# Patient Record
Sex: Male | Born: 1960
Health system: Southern US, Community
[De-identification: ages and names within clinical notes are randomized; demographics above are authoritative.]

## PROBLEM LIST (undated history)

## (undated) DIAGNOSIS — Z8619 Personal history of other infectious and parasitic diseases: Secondary | ICD-10-CM

## (undated) HISTORY — DX: Personal history of other infectious and parasitic diseases: Z86.19

## (undated) HISTORY — PX: TONSILLECTOMY: SUR1361

---

## 2013-11-23 LAB — HEMOGLOBIN A1C: Hemoglobin A1C: 5.8

## 2013-11-23 LAB — LIPID PANEL
Cholesterol: 195 (ref 0–200)
HDL: 44 (ref 35–70)
LDL CALC: 126
TRIGLYCERIDES: 126 (ref 40–160)

## 2013-11-23 LAB — TSH: TSH: 3.91 (ref 0.41–5.90)

## 2014-04-12 HISTORY — PX: OTHER SURGICAL HISTORY: SHX169

## 2015-03-18 DIAGNOSIS — M4712 Other spondylosis with myelopathy, cervical region: Secondary | ICD-10-CM | POA: Insufficient documentation

## 2015-04-13 HISTORY — PX: CERVICAL FUSION: SHX112

## 2015-09-12 DIAGNOSIS — Z981 Arthrodesis status: Secondary | ICD-10-CM | POA: Insufficient documentation

## 2015-11-05 DIAGNOSIS — M542 Cervicalgia: Secondary | ICD-10-CM | POA: Diagnosis not present

## 2015-11-05 DIAGNOSIS — Z981 Arthrodesis status: Secondary | ICD-10-CM | POA: Diagnosis not present

## 2015-11-05 DIAGNOSIS — Z4789 Encounter for other orthopedic aftercare: Secondary | ICD-10-CM | POA: Diagnosis not present

## 2015-11-05 DIAGNOSIS — R200 Anesthesia of skin: Secondary | ICD-10-CM | POA: Diagnosis not present

## 2016-08-04 DIAGNOSIS — Z4789 Encounter for other orthopedic aftercare: Secondary | ICD-10-CM | POA: Diagnosis not present

## 2016-08-04 DIAGNOSIS — Z981 Arthrodesis status: Secondary | ICD-10-CM | POA: Diagnosis not present

## 2016-08-04 DIAGNOSIS — M4802 Spinal stenosis, cervical region: Secondary | ICD-10-CM | POA: Diagnosis not present

## 2016-08-04 DIAGNOSIS — R200 Anesthesia of skin: Secondary | ICD-10-CM | POA: Diagnosis not present

## 2016-08-04 DIAGNOSIS — R202 Paresthesia of skin: Secondary | ICD-10-CM | POA: Diagnosis not present

## 2017-08-05 DIAGNOSIS — M729 Fibroblastic disorder, unspecified: Secondary | ICD-10-CM | POA: Diagnosis not present

## 2017-08-10 ENCOUNTER — Ambulatory Visit: Payer: Self-pay | Admitting: Physician Assistant

## 2017-08-16 ENCOUNTER — Ambulatory Visit: Payer: Self-pay | Admitting: Physician Assistant

## 2017-08-17 ENCOUNTER — Ambulatory Visit (INDEPENDENT_AMBULATORY_CARE_PROVIDER_SITE_OTHER): Payer: BLUE CROSS/BLUE SHIELD | Admitting: Physician Assistant

## 2017-08-17 ENCOUNTER — Encounter: Payer: Self-pay | Admitting: Physician Assistant

## 2017-08-17 VITALS — BP 150/90 | HR 56 | Temp 97.7°F | Ht 72.5 in | Wt 229.4 lb

## 2017-08-17 DIAGNOSIS — Z23 Encounter for immunization: Secondary | ICD-10-CM

## 2017-08-17 DIAGNOSIS — I6521 Occlusion and stenosis of right carotid artery: Secondary | ICD-10-CM

## 2017-08-17 DIAGNOSIS — Z7689 Persons encountering health services in other specified circumstances: Secondary | ICD-10-CM | POA: Diagnosis not present

## 2017-08-17 NOTE — Patient Instructions (Signed)
You will be contacted about your cardiology referral.  I recommend getting a fasting lipid panel checked prior to your cardiology visit.

## 2017-08-17 NOTE — Progress Notes (Signed)
Shawn Kent is a 57 y.o. male here to Establish Care.  I acted as a Neurosurgeon for [employer], PA-C Corky Mull, LPN  History of Present Illness:   Chief Complaint  Patient presents with  . Establish Care    BC/BS  . Needs referral for Cardiology    ? Right Carotid     Acute Concerns: Carotid stenosis  -- wife goes to [facility], and someone there performed an ultrasound scan on his carotids and told him that his R carotid artery had plaque in it. He is here because he states that he would like a cardiologist to evaluate. No family history of stroke. Does have family history of MI, dad was in possible 50-60s when he had his MI. Drinks about 1-2 alcoholic drinks per night. Does not take a daily ASA. Exercises by walking daily, wears a FitBit for a work wellness program. Denies any exertional symptoms such as CP, SOB, or dizziness/lightheadedness, pre-syncope with activity. He reports that he gets annual blood work through work and his cholesterol has been elevated in the past but to his knowledge it is now under control.  Health Maintenance: Immunizations -- Tdap today Colonoscopy -- at age 3, was normal Diet -- eats all food groups, does do a lot of traveling and has poor food choices Sleep habits -- sleeps well, doesn't snore Weight -- Weight: 229 lb 6.1 oz (104 kg) -- this is usual weight for him Body mass index is 30.68 kg/m.   Depression screen PHQ 2/9 08/17/2017  Decreased Interest 0  Down, Depressed, Hopeless 0  PHQ - 2 Score 0    No flowsheet data found.    Past Medical History:  Diagnosis Date  . History of chicken pox      Social History   Socioeconomic History  . Marital status: Married    Spouse name: Not on file  . Number of children: Not on file  . Years of education: Not on file  . Highest education level: Not on file  Occupational History  . Not on file  Social Needs  . Financial resource strain: Not on file  . Food  insecurity:    Worry: Not on file    Inability: Not on file  . Transportation needs:    Medical: Not on file    Non-medical: Not on file  Tobacco Use  . Smoking status: Never Smoker  . Smokeless tobacco: Never Used  Substance and Sexual Activity  . Alcohol use: Yes    Alcohol/week: 6.0 oz    Types: 10 Cans of beer per week  . Drug use: Never  . Sexual activity: Yes  Lifestyle  . Physical activity:    Days per week: Not on file    Minutes per session: Not on file  . Stress: Not on file  Relationships  . Social connections:    Talks on phone: Not on file    Gets together: Not on file    Attends religious service: Not on file    Active member of club or organization: Not on file    Attends meetings of clubs or organizations: Not on file    Relationship status: Not on file  . Intimate partner violence:    Fear of current or ex partner: Not on file    Emotionally abused: Not on file    Physically abused: Not on file    Forced sexual activity: Not on file  Other Topics Concern  . Not on file  Social History Narrative   Works for Engineer, agricultural    Two children -- 47 and 55 y/o   Married    Past Surgical History:  Procedure Laterality Date  . CERVICAL FUSION  2017   C 4, 5, 6, 7  . Right Shoulder Surgery Right 2016  . TONSILLECTOMY      Family History  Problem Relation Age of Onset  . Lung cancer Mother   . Leukemia Father   . Skin cancer Paternal Grandmother     No Known Allergies   Current Medications:   Current Outpatient Medications:  .  ibuprofen (ADVIL,MOTRIN) 200 MG tablet, Take 400 mg by mouth as needed. , Disp: , Rfl:  .  Ibuprofen-diphenhydrAMINE Cit (ADVIL PM PO), Take 1 tablet by mouth at bedtime., Disp: , Rfl:    Review of Systems:   ROS  Negative unless otherwise specified per HPI.  Vitals:   Vitals:   08/17/17 1335  BP: (!) 142/90  Pulse: 60  Temp: 97.7 F (36.5 C)  TempSrc: Oral  SpO2: 96%  Weight: 229 lb 6.1 oz (104 kg)  Height: 6'  0.5" (1.842 m)     Body mass index is 30.68 kg/m.  Physical Exam:   Physical Exam  Constitutional: He appears well-developed. He is cooperative.  Non-toxic appearance. He does not have a sickly appearance. He does not appear ill. No distress.  Cardiovascular: Normal rate, regular rhythm, S1 normal, S2 normal, normal heart sounds and normal pulses.  No LE edema  Pulmonary/Chest: Effort normal and breath sounds normal.  Neurological: He is alert. GCS eye subscore is 4. GCS verbal subscore is 5. GCS motor subscore is 6.  Skin: Skin is warm, dry and intact.  Psychiatric: He has a normal mood and affect. His speech is normal and behavior is normal.  Nursing note and vitals reviewed.   Assessment and Plan:    Shawn Kent was seen today for establish care and needs referral for cardiology.  Diagnoses and all orders for this visit:  Encounter to establish care  Carotid stenosis, right -     Ambulatory referral to Cardiology  Need for prophylactic vaccination with combined diphtheria-tetanus-pertussis (DTP) vaccine -     Tdap vaccine greater than or equal to 7yo IM   Refer to cardiology for further evaluation and treatment. He declined getting labwork done here, states that he is able to get his labwork done through work, and also states that he does not need an order from me to do this. I recommended that he attempt to obtain this labwork prior to his appointment with cardiology.  . Reviewed expectations re: course of current medical issues. . Discussed self-management of symptoms. . Outlined signs and symptoms indicating need for more acute intervention. . Patient verbalized understanding and all questions were answered. . See orders for this visit as documented in the electronic medical record. . Patient received an After-Visit Summary.   CMA or LPN served as scribe during this visit. History, Physical, and Plan performed by medical provider. Documentation and orders reviewed and  attested to.   Jarold Motto, PA-C

## 2017-08-24 LAB — LIPID PANEL
Cholesterol: 186 (ref 0–200)
HDL: 51 (ref 35–70)
LDL CALC: 119
Triglycerides: 67 (ref 40–160)

## 2017-08-24 LAB — BASIC METABOLIC PANEL: GLUCOSE: 93

## 2017-08-25 ENCOUNTER — Telehealth: Payer: Self-pay | Admitting: Physician Assistant

## 2017-08-25 NOTE — Telephone Encounter (Signed)
Pt. Called to report he had fasting lab work 08/24/17 at [facility]: HDL   51  (> 40) LDL    119 (<130) Total cholesterol  186 (<200) Triglycerides   67 ( <150) Cholesterol ratio  3.6  (<5) Glucose  93  (<100) BP Reading  136/75

## 2017-08-25 NOTE — Telephone Encounter (Signed)
See note

## 2017-08-26 ENCOUNTER — Encounter: Payer: Self-pay | Admitting: Physician Assistant

## 2017-08-26 NOTE — Telephone Encounter (Signed)
Spoke to pt, told him we received lab work and Lelon Mast has reviewed your fasting cholesterol labs. Per Lelon Mast she has calculated his 10-year risk of having a major cardiac event, such as stroke or heart attack, and based on his current information and labs, his risk is 5.6%. This is considered borderline.  If we were to start a cholesterol-lowering medication, such as a statin, his risk would decrease to 4.2%.  Regardless, continue to work on minimizing high fat foods and decreasing portions of carbohydrates.  If he is interested in starting a cholesterol medication, please let this provider know and I will send one in for him to start.  I recommend re-checking his cholesterol in 3 months.  Pt verbalized understanding and is declining to start medication at this time. Pt said he has an appt with Cardiology at the end of month and will see what they say. Told pt okay I will put labs in your chart so Cardiology can see them. Pt verbalized understanding.

## 2017-08-26 NOTE — Telephone Encounter (Signed)
Please call patient and let him know that I reviewed his fasting cholesterol labs.  I have calculated his 10-year risk of having a major cardiac event, such as stroke or heart attack, and based on his current information and labs, his risk is 5.6%. This is considered borderline.  If we were to start a cholesterol-lowering medication, such as a statin, his risk would decrease to 4.2%.  Regardless, continue to work on minimizing high fat foods and decreasing portions of carbohydrates.  If he is interested in starting a cholesterol medication, please let this provider know and I will send one in for him to start.  I recommend re-checking his cholesterol in 3 months.  Lelon Mast

## 2017-09-09 ENCOUNTER — Ambulatory Visit (INDEPENDENT_AMBULATORY_CARE_PROVIDER_SITE_OTHER): Payer: BLUE CROSS/BLUE SHIELD | Admitting: Cardiovascular Disease

## 2017-09-09 ENCOUNTER — Encounter: Payer: Self-pay | Admitting: Cardiovascular Disease

## 2017-09-09 VITALS — BP 150/92 | HR 61 | Ht 72.5 in | Wt 228.0 lb

## 2017-09-09 DIAGNOSIS — I739 Peripheral vascular disease, unspecified: Secondary | ICD-10-CM

## 2017-09-09 DIAGNOSIS — E785 Hyperlipidemia, unspecified: Secondary | ICD-10-CM | POA: Insufficient documentation

## 2017-09-09 DIAGNOSIS — I10 Essential (primary) hypertension: Secondary | ICD-10-CM | POA: Diagnosis not present

## 2017-09-09 DIAGNOSIS — I6521 Occlusion and stenosis of right carotid artery: Secondary | ICD-10-CM | POA: Diagnosis not present

## 2017-09-09 DIAGNOSIS — E7800 Pure hypercholesterolemia, unspecified: Secondary | ICD-10-CM | POA: Diagnosis not present

## 2017-09-09 DIAGNOSIS — I779 Disorder of arteries and arterioles, unspecified: Secondary | ICD-10-CM | POA: Insufficient documentation

## 2017-09-09 NOTE — Patient Instructions (Signed)
Medication Instructions: Your physician recommends that you continue on your current medications as directed. Please refer to the Current Medication list given to you today.  Labwork: Your physician recommends that you return for a FASTING lipid profile and hepatic function panel in 3 months (August).   Testing/Procedures: Your physician has requested that you have a carotid duplex. This test is an ultrasound of the carotid arteries in your neck. It looks at blood flow through these arteries that supply the brain with blood. Allow one hour for this exam. There are no restrictions or special instructions.  Follow-Up: Your physician recommends that you schedule a follow-up appointment in: 1 month with PharmD. Your physician has requested that you regularly monitor and record your blood pressure readings at home. Please use the same machine at the same time of day to check your readings and record them to bring to your follow-up visit.  Your physician recommends that you schedule a follow-up appointment in: 3 months with Dr. Allyson Sabal.

## 2017-09-09 NOTE — Assessment & Plan Note (Signed)
Recent duplex in his primary care doctor's office raise the possibility of right carotid artery disease.  I cannot hear a bruit on exam.  We will repeat carotid Doppler studies.

## 2017-09-09 NOTE — Progress Notes (Signed)
09/09/2017 [name]   [birth year]  [id number]  Primary Physician Jarold Motto, PA Primary Cardiologist: Runell Gess MD Nicholes Calamity, [place]  HPI:  Shawn Kent is a 57 y.o. mildly overweight married Caucasian male father of 2 children who is accompanied by his wife Shawn Kent today.  He is referred by Jarold Motto, PA for cardiovascular evaluation because of family history of heart disease plaque in his right carotid artery by duplex ultrasound.  His risk factors include family history with a father who had his first myocardial infarction in his 66s.  He does have mild hyperlipidemia with an LDL of 119 and mild hypertension as well by readings in doctor's offices recently.  He is never had a heart attack or stroke.  He denies chest pain or shortness of breath.  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   Current Meds  Medication Sig  . ibuprofen (ADVIL,MOTRIN) 200 MG tablet Take 400 mg by mouth as needed.   . Ibuprofen-diphenhydrAMINE Cit (ADVIL PM PO) Take 1 tablet by mouth at bedtime.     No Known Allergies  Social History   Socioeconomic History  . Marital status: Married    Spouse name: Not on file  . Number of children: Not on file  . Years of education: Not on file  . Highest education level: Not on file  Occupational History  . Not on file  Social Needs  . Financial resource strain: Not on file  . Food insecurity:    Worry: Not on file    Inability: Not on file  . Transportation needs:    Medical: Not on file    Non-medical: Not on file  Tobacco Use  . Smoking status: Never Smoker  . Smokeless tobacco: Never Used  Substance and Sexual Activity  . Alcohol use: Yes    Alcohol/week: 6.0 oz    Types: 10 Cans of beer per week  . Drug use: Never  . Sexual activity: Yes  Lifestyle  . Physical activity:    Days per week: Not on file    Minutes per session: Not on file  . Stress: Not on file  Relationships  .  Social connections:    Talks on phone: Not on file    Gets together: Not on file    Attends religious service: Not on file    Active member of club or organization: Not on file    Attends meetings of clubs or organizations: Not on file    Relationship status: Not on file  . Intimate partner violence:    Fear of current or ex partner: Not on file    Emotionally abused: Not on file    Physically abused: Not on file    Forced sexual activity: Not on file  Other Topics Concern  . Not on file  Social History Narrative   Works for Engineer, agricultural    Two children -- 46 and 32 y/o   Married     Review of Systems: General: negative for chills, fever, night sweats or weight changes.  Cardiovascular: negative for chest pain, dyspnea on exertion, edema, orthopnea, palpitations, paroxysmal nocturnal dyspnea or shortness of breath Dermatological: negative for rash Respiratory: negative for cough or wheezing Urologic: negative for hematuria Abdominal: negative for nausea, vomiting, diarrhea, bright red blood per rectum, melena, or hematemesis Neurologic: negative for visual changes, syncope, or dizziness All other systems reviewed and are otherwise negative except as noted above.  Blood pressure (!) 150/92, pulse 61, height 6' 0.5" (1.842 m), weight 228 lb (103.4 kg).  General appearance: alert and no distress Neck: no adenopathy, no carotid bruit, no JVD, supple, symmetrical, trachea midline and thyroid not enlarged, symmetric, no tenderness/mass/nodules Lungs: clear to auscultation bilaterally Heart: regular rate and rhythm, S1, S2 normal, no murmur, click, rub or gallop Extremities: extremities normal, atraumatic, no cyanosis or edema Pulses: 2+ and symmetric Skin: Skin color, texture, turgor normal. No rashes or lesions Neurologic: Alert and oriented X 3, normal strength and tone. Normal symmetric reflexes. Normal coordination and gait  EKG sinus rhythm at 61 without ST or T wave changes.   I personally reviewed this EKG.  ASSESSMENT AND PLAN:   Hyperlipidemia History of mild hyperlipidemia with LDL measured 08/24/2017 at 956 DL of 51.  He does admit to dietary indiscretion .  We have discussed heart healthy diet and we will recheck a lipid and liver profile in 3 months.  Essential hypertension Recent blood pressure readings have been in the 150/90 range although he says at home is lower.  We talked about salt restriction.  He will keep a blood pressure log daily over the next 30 days and will see Baxter Hire back in the office for review.  Carotid artery disease (HCC) Recent duplex in his primary care doctor's office raise the possibility of right carotid artery disease.  I cannot hear a bruit on exam.  We will repeat carotid Doppler studies.      Runell Gess MD FACP,FACC,FAHA, [facility] 09/09/2017 10:05 AM

## 2017-09-09 NOTE — Assessment & Plan Note (Signed)
History of mild hyperlipidemia with LDL measured 08/24/2017 at 098119 DL of 51.  He does admit to dietary indiscretion .  We have discussed heart healthy diet and we will recheck a lipid and liver profile in 3 months.

## 2017-09-09 NOTE — Assessment & Plan Note (Signed)
Recent blood pressure readings have been in the 150/90 range although he says at home is lower.  We talked about salt restriction.  He will keep a blood pressure log daily over the next 30 days and will see Shawn Kent back in the office for review.

## 2017-09-19 ENCOUNTER — Ambulatory Visit (HOSPITAL_COMMUNITY)
Admission: RE | Admit: 2017-09-19 | Discharge: 2017-09-19 | Disposition: A | Discharge status: Home / Self Care | Payer: BLUE CROSS/BLUE SHIELD | Source: Ambulatory Visit | Attending: Cardiovascular Disease | Admitting: Cardiovascular Disease

## 2017-09-19 DIAGNOSIS — E785 Hyperlipidemia, unspecified: Secondary | ICD-10-CM | POA: Diagnosis not present

## 2017-09-19 DIAGNOSIS — I6521 Occlusion and stenosis of right carotid artery: Secondary | ICD-10-CM | POA: Diagnosis not present

## 2017-09-19 DIAGNOSIS — I10 Essential (primary) hypertension: Secondary | ICD-10-CM | POA: Insufficient documentation

## 2017-09-19 DIAGNOSIS — I6523 Occlusion and stenosis of bilateral carotid arteries: Secondary | ICD-10-CM | POA: Diagnosis not present

## 2017-10-19 ENCOUNTER — Ambulatory Visit (INDEPENDENT_AMBULATORY_CARE_PROVIDER_SITE_OTHER): Payer: BLUE CROSS/BLUE SHIELD | Admitting: Pharmacist Clinician (PhC)/ Clinical Pharmacy Specialist

## 2017-10-19 DIAGNOSIS — I10 Essential (primary) hypertension: Secondary | ICD-10-CM | POA: Diagnosis not present

## 2017-10-19 NOTE — Progress Notes (Signed)
     10/20/2017 Shawn SalmMatthew Dente 12/06/1960 [id number]   HPI:  Shawn Kent is a 57 y.o. male patient of Dr Allyson SabalBerry, with a PMH below who presents today for hypertension clinic evaluation.  In addition to hypertension, his medical history is significant for hyperlipidemia and CAD of carotid arteries.  He has a family history of coronary disease, as his father had his first MI in his 2050's.   When he saw Dr. Allyson SabalBerry last month, he noted that BP readings in the MD offices were usually higher than those he got at home or [employer] drives.   He was asked to keep a month-long diary of readings, and returns today with his home cuff.  Unfortunately he forgot this list of readings, but notes that they were mostly in the 140/80's.  States that until the past year he was always in the 120 systolic range.    He currently takes no medications.    Blood Pressure Goal:  130/80  Current Medications:  None  Family Hx:  Father with first MI in 5650's   Social Hx:  No tobacco, some alcohol 1-3 drinks most days; coffee at home most mornings; occasional soda   Diet:  Pleas Kochravels a lot with his work so eats out regularly; is not adding salt; trying to eat less fast food (in the past month has only eaten at [employer] 3-4 times vs maybe 12-15 in previous month)  Exercise:  Walks regularly, hikes, bird watches; nothing strenuous or cardiovascur  Intolerances:   nkda  CrCl cannot be calculated (No order found.).  Wt Readings from Last 3 Encounters:  09/09/17 228 lb (103.4 kg)  08/17/17 229 lb 6.1 oz (104 kg)   BP Readings from Last 3 Encounters:  10/19/17 140/84  09/09/17 (!) 150/92  08/17/17 (!) 150/90   Pulse Readings from Last 3 Encounters:  10/19/17 68  09/09/17 61  08/17/17 (!) 56    Current Outpatient Medications  Medication Sig Dispense Refill  . ibuprofen (ADVIL,MOTRIN) 200 MG tablet Take 400 mg by mouth as needed.     . Ibuprofen-diphenhydrAMINE Cit (ADVIL PM PO) Take 1 tablet by  mouth at bedtime.     No current facility-administered medications for this visit.     No Known Allergies  Past Medical History:  Diagnosis Date  . History of chicken pox     Blood pressure 140/84, pulse 68.  Essential hypertension Patient with hypertension, but no other disease states.  His ASCVD 10 year risk is 5.9%.  Based on this, his current BP goal is > 140/90.  We had a long discussion on the need for medication versus working on lifestyle modifications.  He admits to needing to make changes in his eating habits and get back into exercise.   Ultimately he would like to take 2-3 months and try to make some lifestyle changes, knowing that if he is still > 140 in 2-3 months.   He is due to see Dr. Allyson SabalBerry at the end of August.  We can follow up after that should he need medication.     Phillips HayKristin Lastacia Solum PharmD CPP [facility] [address] [place], [place] [id number] [phone]

## 2017-10-19 NOTE — Patient Instructions (Addendum)
Return for a a follow up appointment in 3 months  Check your blood pressure at home 3-4 times per week and keep record of the readings.  Take your BP meds as follows:   Nothing at this time  Bring all of your meds, your BP cuff and your record of home blood pressures to your next appointment.  Exercise as you're able, try to walk approximately 30 minutes per day.  Keep salt intake to a minimum, especially watch canned and prepared boxed foods.  Eat more fresh fruits and vegetables and fewer canned items.  Avoid eating in fast food restaurants.    HOW TO TAKE YOUR BLOOD PRESSURE: . Rest 5 minutes before taking your blood pressure. .  Don't smoke or drink caffeinated beverages for at least 30 minutes before. . Take your blood pressure before (not after) you eat. . Sit comfortably with your back supported and both feet on the floor (don't cross your legs). . Elevate your arm to heart level on a table or a desk. . Use the proper sized cuff. It should fit smoothly and snugly around your bare upper arm. There should be enough room to slip a fingertip under the cuff. The bottom edge of the cuff should be 1 inch above the crease of the elbow. . Ideally, take 3 measurements at one sitting and record the average.       Exercising to Stay Healthy Exercising regularly is important. It has many health benefits, such as:  Improving your overall fitness, flexibility, and endurance.  Increasing your bone density.  Helping with weight control.  Decreasing your body fat.  Increasing your muscle strength.  Reducing stress and tension.  Improving your overall health.  In order to become healthy and stay healthy, it is recommended that you do moderate-intensity and vigorous-intensity exercise. You can tell that you are exercising at a moderate intensity if you have a higher heart rate and faster breathing, but you are still able to hold a conversation. You can tell that you are exercising at a  vigorous intensity if you are breathing much harder and faster and cannot hold a conversation while exercising. How often should I exercise? Choose an activity that you enjoy and set realistic goals. Your health care provider can help you to make an activity plan that works for you. Exercise regularly as directed by your health care provider. This may include:  Doing resistance training twice each week, such as: ? Push-ups. ? Sit-ups. ? Lifting weights. ? Using resistance bands.  Doing a given intensity of exercise for a given amount of time. Choose from these options: ? 150 minutes of moderate-intensity exercise every week. ? 75 minutes of vigorous-intensity exercise every week. ? A mix of moderate-intensity and vigorous-intensity exercise every week.  Children, pregnant women, people who are out of shape, people who are overweight, and older adults may need to consult a health care provider for individual recommendations. If you have any sort of medical condition, be sure to consult your health care provider before starting a new exercise program. What are some exercise ideas? Some moderate-intensity exercise ideas include:  Walking at a rate of 1 mile in 15 minutes.  Biking.  Hiking.  Golfing.  Dancing.  Some vigorous-intensity exercise ideas include:  Walking at a rate of at least 4.5 miles per hour.  Jogging or running at a rate of 5 miles per hour.  Biking at a rate of at least 10 miles per hour.  Lap swimming.  Roller-skating or in-line skating.  Cross-country skiing.  Vigorous competitive sports, such as football, basketball, and soccer.  Jumping rope.  Aerobic dancing.  What are some everyday activities that can help me to get exercise?  Yard work, such as: ? Pushing a Surveyor, mining. ? Raking and bagging leaves.  Washing and waxing your car.  Pushing a stroller.  Shoveling snow.  Gardening.  Washing windows or floors. How can I be more active in  my day-to-day activities?  Use the stairs instead of the elevator.  Take a walk during your lunch break.  If you drive, park your car farther away from work or school.  If you take public transportation, get off one stop early and walk the rest of the way.  Make all of your phone calls while standing up and walking around.  Get up, stretch, and walk around every 30 minutes throughout the day. What guidelines should I follow while exercising?  Do not exercise so much that you hurt yourself, feel dizzy, or get very short of breath.  Consult your health care provider before starting a new exercise program.  Wear comfortable clothes and shoes with good support.  Drink plenty of water while you exercise to prevent dehydration or heat stroke. Body water is lost during exercise and must be replaced.  Work out until you breathe faster and your heart beats faster. This information is not intended to replace advice given to you by your health care provider. Make sure you discuss any questions you have with your health care provider. Document Released: 05/01/2010 Document Revised: 09/04/2015 Document Reviewed: 08/30/2013 Elsevier Interactive Patient Education  [employer].

## 2017-10-20 ENCOUNTER — Encounter: Payer: Self-pay | Admitting: Pharmacist Clinician (PhC)/ Clinical Pharmacy Specialist

## 2017-10-20 NOTE — Assessment & Plan Note (Addendum)
Patient with hypertension, but no other disease states.  His ASCVD 10 year risk is 5.9%.  Based on this, his current BP goal is > 140/90.  We had a long discussion on the need for medication versus working on lifestyle modifications.  He admits to needing to make changes in his eating habits and get back into exercise.   Ultimately he would like to take 2-3 months and try to make some lifestyle changes, knowing that if he is still > 140 in 2-3 months.   He is due to see Dr. Allyson SabalBerry at the end of August.  We can follow up after that should he need medication.

## 2017-11-23 DIAGNOSIS — E7800 Pure hypercholesterolemia, unspecified: Secondary | ICD-10-CM | POA: Diagnosis not present

## 2017-11-23 LAB — HEPATIC FUNCTION PANEL
ALT: 20 IU/L (ref 0–44)
AST: 15 IU/L (ref 0–40)
Albumin: 4.2 g/dL (ref 3.5–5.5)
Alkaline Phosphatase: 68 IU/L (ref 39–117)
Bilirubin Total: 0.3 mg/dL (ref 0.0–1.2)
Bilirubin, Direct: 0.09 mg/dL (ref 0.00–0.40)
Total Protein: 6.4 g/dL (ref 6.0–8.5)

## 2017-11-23 LAB — LIPID PANEL
CHOL/HDL RATIO: 4.3 ratio (ref 0.0–5.0)
Cholesterol, Total: 192 mg/dL (ref 100–199)
HDL: 45 mg/dL (ref >39)
LDL CALC: 128 mg/dL — AB (ref 0–99)
TRIGLYCERIDES: 93 mg/dL (ref 0–149)
VLDL CHOLESTEROL CAL: 19 mg/dL (ref 5–40)

## 2017-12-09 ENCOUNTER — Encounter: Payer: Self-pay | Admitting: Cardiovascular Disease

## 2017-12-09 ENCOUNTER — Ambulatory Visit (INDEPENDENT_AMBULATORY_CARE_PROVIDER_SITE_OTHER): Payer: BLUE CROSS/BLUE SHIELD | Admitting: Cardiovascular Disease

## 2017-12-09 VITALS — BP 120/80 | HR 60 | Ht 73.0 in | Wt 224.0 lb

## 2017-12-09 DIAGNOSIS — E7800 Pure hypercholesterolemia, unspecified: Secondary | ICD-10-CM | POA: Diagnosis not present

## 2017-12-09 MED ORDER — ROSUVASTATIN CALCIUM 5 MG PO TABS: ORAL_TABLET | ORAL | 3 refills | Status: DC

## 2017-12-09 MED ORDER — COQ10 200 MG PO CAPS: 1.0000 | ORAL_CAPSULE | Freq: Every day | ORAL | 3 refills | Status: DC

## 2017-12-09 NOTE — Progress Notes (Signed)
12/09/2017 Shawn Kent   [birth year]  [id number]  Primary Physician Jarold Motto, PA Primary Cardiologist: Runell Gess MD FACP, [place], [place], [place]  HPI:  Shawn Kent is a 57 y.o.  mildly overweight married Caucasian male father of 2 children who I last saw in the office 09/09/2017. He is referred by Jarold Motto, PA for cardiovascular evaluation because of family history of heart disease plaque in his right carotid artery by duplex ultrasound.  His risk factors include family history with a father who had his first myocardial infarction in his 16s.  He does have mild hyperlipidemia with an LDL of 119 and mild hypertension as well by readings in doctor's offices recently.  He is never had a heart attack or stroke.  He denies chest pain or shortness of breath.  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 He had carotid Dopplers performed in our office 09/19/2017 which were in entirely normal.  Current Meds  Medication Sig  . ibuprofen (ADVIL,MOTRIN) 200 MG tablet Take 400 mg by mouth as needed.   . Ibuprofen-diphenhydrAMINE Cit (ADVIL PM PO) Take 1 tablet by mouth at bedtime.     No Known Allergies  Social History   Socioeconomic History  . Marital status: Married    Spouse name: Not on file  . Number of children: Not on file  . Years of education: Not on file  . Highest education level: Not on file  Occupational History  . Not on file  Social Needs  . Financial resource strain: Not on file  . Food insecurity:    Worry: Not on file    Inability: Not on file  . Transportation needs:    Medical: Not on file    Non-medical: Not on file  Tobacco Use  . Smoking status: Never Smoker  . Smokeless tobacco: Never Used  Substance and Sexual Activity  . Alcohol use: Yes    Alcohol/week: 10.0 standard drinks    Types: 10 Cans of beer per week  . Drug use: Never  . Sexual activity: Yes  Lifestyle  . Physical activity:    Days per  week: Not on file    Minutes per session: Not on file  . Stress: Not on file  Relationships  . Social connections:    Talks on phone: Not on file    Gets together: Not on file    Attends religious service: Not on file    Active member of club or organization: Not on file    Attends meetings of clubs or organizations: Not on file    Relationship status: Not on file  . Intimate partner violence:    Fear of current or ex partner: Not on file    Emotionally abused: Not on file    Physically abused: Not on file    Forced sexual activity: Not on file  Other Topics Concern  . Not on file  Social History Narrative   Works for Engineer, agricultural    Two children -- 75 and 21 y/o   Married     Review of Systems: General: negative for chills, fever, night sweats or weight changes.  Cardiovascular: negative for chest pain, dyspnea on exertion, edema, orthopnea, palpitations, paroxysmal nocturnal dyspnea or shortness of breath Dermatological: negative for rash Respiratory: negative for cough or wheezing Urologic: negative for hematuria Abdominal: negative for nausea, vomiting, diarrhea, bright red blood per rectum, melena, or hematemesis Neurologic: negative for visual changes, syncope, or dizziness  All other systems reviewed and are otherwise negative except as noted above.    Blood pressure 120/80, pulse 60, height 6\' 1"  (1.854 m), weight 224 lb (101.6 kg), SpO2 96 %.  General appearance: alert and no distress Neck: no adenopathy, no carotid bruit, no JVD, supple, symmetrical, trachea midline and thyroid not enlarged, symmetric, no tenderness/mass/nodules Lungs: clear to auscultation bilaterally Heart: regular rate and rhythm, S1, S2 normal, no murmur, click, rub or gallop Extremities: extremities normal, atraumatic, no cyanosis or edema Pulses: 2+ and symmetric Skin: Skin color, texture, turgor normal. No rashes or lesions Neurologic: Alert and oriented X 3, normal strength and tone. Normal  symmetric reflexes. Normal coordination and gait  EKG not performed today.  ASSESSMENT AND PLAN:   Hyperlipidemia History of mild hyperlipidemia with an LDL approximately 128.  Based on his family history.  I am going to put him on low-dose Crestor 5 mg twice a week and we will recheck.  Carotid artery disease (HCC) Prior history of minimal cord carotid artery disease with recent carotid Doppler performed in our office 09/19/2017 that was entirely normal.      Runell GessJonathan J. Rendon Howell MD [facility] CenterFACP,FACC,FAHA, [facility] 12/09/2017 10:52 AM

## 2017-12-09 NOTE — Assessment & Plan Note (Signed)
Prior history of minimal cord carotid artery disease with recent carotid Doppler performed in our office 09/19/2017 that was entirely normal.

## 2017-12-09 NOTE — Patient Instructions (Signed)
Medication Instructions:   START ROSUVASTATIN 5 MG TWICE WEEKLY  START COQ10 200 MG ONCE DAILY  Labwork:  Your physician recommends that you return for lab work in:3 MONTHS PRIOR TO EATING  Testing/Procedures:  CORONARY CALCIUM SCORE  Follow-Up:  Your physician wants you to follow-up in: ONE YEAR WITH DR San MorelleBERRY You will receive a reminder letter in the mail two months in advance. If you don't receive a letter, please call our office to schedule the follow-up appointment.   If you need a refill on your cardiac medications before your next appointment, please call your pharmacy.

## 2017-12-09 NOTE — Assessment & Plan Note (Signed)
History of mild hyperlipidemia with an LDL approximately 128.  Based on his family history.  I am going to put him on low-dose Crestor 5 mg twice a week and we will recheck.

## 2017-12-23 ENCOUNTER — Ambulatory Visit (INDEPENDENT_AMBULATORY_CARE_PROVIDER_SITE_OTHER)
Admission: RE | Admit: 2017-12-23 | Discharge: 2017-12-23 | Disposition: A | Discharge status: Home / Self Care | Payer: Self-pay | Source: Ambulatory Visit | Attending: Cardiovascular Disease | Admitting: Cardiovascular Disease

## 2017-12-23 DIAGNOSIS — E7800 Pure hypercholesterolemia, unspecified: Secondary | ICD-10-CM

## 2018-03-08 ENCOUNTER — Encounter: Payer: Self-pay | Admitting: *Deleted

## 2018-03-08 DIAGNOSIS — E7800 Pure hypercholesterolemia, unspecified: Secondary | ICD-10-CM | POA: Diagnosis not present

## 2018-03-08 LAB — HEPATIC FUNCTION PANEL
ALBUMIN: 4.2 g/dL (ref 3.5–5.5)
ALT: 21 IU/L (ref 0–44)
AST: 19 IU/L (ref 0–40)
Alkaline Phosphatase: 63 IU/L (ref 39–117)
BILIRUBIN TOTAL: 0.5 mg/dL (ref 0.0–1.2)
BILIRUBIN, DIRECT: 0.15 mg/dL (ref 0.00–0.40)
Total Protein: 6.3 g/dL (ref 6.0–8.5)

## 2018-03-08 LAB — LIPID PANEL
CHOL/HDL RATIO: 3.1 ratio (ref 0.0–5.0)
CHOLESTEROL TOTAL: 166 mg/dL (ref 100–199)
HDL: 53 mg/dL (ref >39)
LDL CALC: 98 mg/dL (ref 0–99)
TRIGLYCERIDES: 75 mg/dL (ref 0–149)
VLDL Cholesterol Cal: 15 mg/dL (ref 5–40)

## 2018-03-13 ENCOUNTER — Telehealth: Payer: Self-pay | Admitting: *Deleted

## 2018-03-13 DIAGNOSIS — E7800 Pure hypercholesterolemia, unspecified: Secondary | ICD-10-CM

## 2018-03-13 MED ORDER — ROSUVASTATIN CALCIUM 5 MG PO TABS: 5.0000 mg | ORAL_TABLET | Freq: Every day | ORAL | 3 refills | Status: DC

## 2018-03-13 NOTE — Telephone Encounter (Signed)
-----   Message from Runell GessJonathan J Berry, MD sent at 03/10/2018  8:13 AM EST ----- Improved lipid profile but still not at goal given risk factor profile.  Increase Crestor from every other day to daily and recheck in 3 months.

## 2018-03-13 NOTE — Telephone Encounter (Signed)
Results released to my chart  New script sent to the pharmacy and Lab orders mailed to the pt. 

## 2018-03-16 DIAGNOSIS — H00012 Hordeolum externum right lower eyelid: Secondary | ICD-10-CM | POA: Diagnosis not present

## 2018-03-16 DIAGNOSIS — H524 Presbyopia: Secondary | ICD-10-CM | POA: Diagnosis not present

## 2018-03-17 MED ORDER — ROSUVASTATIN CALCIUM 5 MG PO TABS: 5.0000 mg | ORAL_TABLET | Freq: Every day | ORAL | 3 refills | Status: DC

## 2018-03-17 NOTE — Telephone Encounter (Signed)
Follow up    Patient is calling in reference to his lab results.

## 2018-03-17 NOTE — Addendum Note (Signed)
Addended by: Freddi StarrMATHIS, Keifer Habib W on: 03/17/2018 10:41 AM   Modules accepted: Orders

## 2018-03-17 NOTE — Telephone Encounter (Signed)
Spoke with pt, he was taking crestor 2 days a week not every other day, he will increase to 3 days a week and recheck labs.

## 2018-03-23 DIAGNOSIS — Z1211 Encounter for screening for malignant neoplasm of colon: Secondary | ICD-10-CM | POA: Diagnosis not present

## 2018-03-23 DIAGNOSIS — Z86010 Personal history of colonic polyps: Secondary | ICD-10-CM | POA: Diagnosis not present

## 2018-03-31 DIAGNOSIS — Z1211 Encounter for screening for malignant neoplasm of colon: Secondary | ICD-10-CM | POA: Diagnosis not present

## 2018-03-31 LAB — HM COLONOSCOPY

## 2018-04-20 ENCOUNTER — Encounter: Payer: Self-pay | Admitting: Physician Assistant

## 2018-08-08 ENCOUNTER — Telehealth: Payer: Self-pay | Admitting: Cardiovascular Disease

## 2018-08-10 ENCOUNTER — Telehealth: Payer: Self-pay

## 2018-08-10 ENCOUNTER — Telehealth (INDEPENDENT_AMBULATORY_CARE_PROVIDER_SITE_OTHER): Payer: BLUE CROSS/BLUE SHIELD | Admitting: Cardiovascular Disease

## 2018-08-10 DIAGNOSIS — E782 Mixed hyperlipidemia: Secondary | ICD-10-CM

## 2018-08-10 DIAGNOSIS — I10 Essential (primary) hypertension: Secondary | ICD-10-CM | POA: Diagnosis not present

## 2018-08-10 NOTE — Patient Instructions (Signed)
Medication Instructions:  Your physician recommends that you continue on your current medications as directed. Please refer to the Current Medication list given to you today.  If you need a refill on your cardiac medications before your next appointment, please call your pharmacy.   Lab work: Your physician recommends that you return for lab work: FASTING LIPID PROFILE AND LIVER FUNCTION TEST. YOU WILL RECEIVE A LAB SLIP IN THE MAIL. PLEASE DO NOT EAT OR DRINK (EXCEPT WATER) ANYTHING AFTER MIDNIGHT ON THE DAY YOU CHOOSE TO PRESENT FOR LAB WORK. YOU MAY EAT AFTER YOUR BLOOD HAS BEEN COLLECTED. NO APPOINTMENT IS NEEDED.  If you have labs (blood work) drawn today and your tests are completely normal, you will receive your results only by: Marland Kitchen MyChart Message (if you have MyChart) OR . A paper copy in the mail If you have any lab test that is abnormal or we need to change your treatment, we will call you to review the results.  Testing/Procedures: NONE  Follow-Up: At [facility], you and your health needs are our priority.  As part of our continuing mission to provide you with exceptional heart care, we have created designated Provider Care Teams.  These Care Teams include your primary Cardiologist (physician) and Advanced Practice Providers (APPs -  Physician Assistants and Nurse Practitioners) who all work together to provide you with the care you need, when you need it. You will need a follow up appointment in 6 months with Dr. Allyson Sabal.  Please call our office 2 months in advance to schedule this appointment.    Any Other Special Instructions Will Be Listed Below (If Applicable). KEEP A BLOOD PRESSURE LOG FOR 30 DAYS THEN FOLLOW UP WITH A CLINICAL PHARMACIST IN THE HYPERTENSION CLINIC. YOU WILL NEED AN APPOINTMENT.

## 2018-08-10 NOTE — Progress Notes (Signed)
Virtual Visit via Video Note   This visit type was conducted due to national recommendations for restrictions regarding the COVID-19 Pandemic (e.g. social distancing) in an effort to limit this patient's exposure and mitigate transmission in our community.  Due to his co-morbid illnesses, this patient is at least at moderate risk for complications without adequate follow up.  This format is felt to be most appropriate for this patient at this time.  All issues noted in this document were discussed and addressed.  A limited physical exam was performed with this format.  Please refer to the patient's chart for his consent to tele[facility].   Evaluation Performed:  Follow-up visit  Date:  08/10/2018   ID:  Shawn Kent, DOB 10/19/1960, MRN [id number]  Patient Location: Other:  Outside walking on a trail Provider Location: Home  PCP:  Jarold MottoWorley, Samantha, PA  Cardiologist: Dr. Nanetta BattyJonathan Lyndle Pang Electrophysiologist:  None   Chief Complaint: Hyperlipidemia/hypertension  History of Present Illness:    Shawn SalmMatthew Kent is a 58 y.o.  mildly overweight married Caucasian male father of 2 children who I last saw in the office 12/09/2017. 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  He is referred by Jarold MottoSamantha Worley, PA for cardiovascular evaluation because of family history of heart disease plaque in his right carotid artery by duplex ultrasound. His risk factors include family history with a father who had his first myocardial infarction in his 3350s. He does have mild hyperlipidemia with an LDL of 119 and mild hypertension as well by readings in doctor's offices recently. He is never had a heart attack or stroke. He denies chest pain or shortness of breath. 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 He had carotid Dopplers performed in our office 09/19/2017 which were  in entirely normal.  Since I saw him close to a year ago he is remained stable.  I did begin him on Crestor every other day and ultimately increase that to daily with his last lipid profile performed 03/08/2018 revealing total cluster 166, LDL of 98 and HDL 53.  He is trying to make lifestyle modifications but has not lost any weight.  He does get out and exercise some without limitation.  He is had his blood pressure checked several times recently when having blood drawn which was elevated especially when he found out that he had lost his job.  The patient does not have symptoms concerning for COVID-19 infection (fever, chills, cough, or new shortness of breath).    Past Medical History:  Diagnosis Date  . History of chicken pox    Past Surgical History:  Procedure Laterality Date  . CERVICAL FUSION  2017   C 4, 5, 6, 7  . Right Shoulder Surgery Right 2016  . TONSILLECTOMY       Current Meds  Medication Sig  . Coenzyme Q10 (COQ10) 200 MG CAPS Take 1 capsule by mouth daily.  Marland Kitchen. ibuprofen (ADVIL,MOTRIN) 200 MG tablet Take 400 mg by mouth as needed.   . Ibuprofen-diphenhydrAMINE Cit (ADVIL PM PO) Take 1 tablet by mouth at bedtime.  . rosuvastatin (CRESTOR) 5 MG tablet Take 1 tablet (5 mg total) by mouth daily at 6 PM. Take 1 tablet three days a week (Patient taking differently: Take 5 mg by mouth. Take 1 tablet six days a week.)     Allergies:   Patient  has no known allergies.   Social History   Tobacco Use  . Smoking status: Never Smoker  . Smokeless tobacco: Never Used  Substance Use Topics  . Alcohol use: Yes    Alcohol/week: 10.0 standard drinks    Types: 10 Cans of beer per week  . Drug use: Never     Family Hx: The patient's family history includes Leukemia in his father; Lung cancer in his mother; Skin cancer in his paternal grandmother.  ROS:   Please see the history of present illness.     All other systems reviewed and are negative.   Prior CV studies:   The  following studies were reviewed today:  None  Labs/Other Tests and Data Reviewed:    EKG:  No ECG reviewed.  Recent Labs: 03/08/2018: ALT 21   Recent Lipid Panel Lab Results  Component Value Date/Time   CHOL 166 03/08/2018 09:40 AM   TRIG 75 03/08/2018 09:40 AM   HDL 53 03/08/2018 09:40 AM   CHOLHDL 3.1 03/08/2018 09:40 AM   LDLCALC 98 03/08/2018 09:40 AM    Wt Readings from Last 3 Encounters:  08/10/18 223 lb (101.2 kg)  12/09/17 224 lb (101.6 kg)  09/09/17 228 lb (103.4 kg)     Objective:    Vital Signs:  Pulse 66   Ht 6\' 1"  (1.854 m)   Wt 223 lb (101.2 kg)   BMI 29.42 kg/m    VITAL SIGNS:  reviewed GEN:  no acute distress RESPIRATORY:  normal respiratory effort, symmetric expansion NEURO:  alert and oriented x 3, no obvious focal deficit PSYCH:  normal affect  ASSESSMENT & PLAN:    1. Hyperlipidemia- history of hyperlipidemia on Crestor with lipid profile performed 03/08/2018 revealing total cluster 166, LDL 98 and HDL of 55. 2. Essential hypertension- he has had several blood pressure readings during blood donation which have been elevated.  Does have a blood pressure cuff at home.  We talked about dietary modification and exercise.  I have asked him to keep a blood pressure log for the next 30 days.  I am going to have Kristen, our pharmacist who runs our hypertension clinic, call him in 30 days to review these, calibrate his home cuff and make further recommendations.  COVID-19 Education: The signs and symptoms of COVID-19 were discussed with the patient and how to seek care for testing (follow up with PCP or arrange E-visit).  The importance of social distancing was discussed today.  Time:   Today, I have spent 12 minutes with the patient with telehealth technology discussing the above problems.     Medication Adjustments/Labs and Tests Ordered: Current medicines are reviewed at length with the patient today.  Concerns regarding medicines are outlined  above.   Tests Ordered: No orders of the defined types were placed in this encounter.   Medication Changes: No orders of the defined types were placed in this encounter.   Disposition:  Follow up in 6 month(s)  Signed, Nanetta Batty, MD  08/10/2018 10:09 AM    [facility]

## 2018-08-10 NOTE — Telephone Encounter (Signed)
Patient and/or DPR-approved person aware of AVS instructions and verbalized understanding. Letter including After Visit Summary and any other necessary documents to be mailed to the patient's address on file.  

## 2018-08-26 DIAGNOSIS — R229 Localized swelling, mass and lump, unspecified: Secondary | ICD-10-CM | POA: Diagnosis not present

## 2018-08-26 DIAGNOSIS — W57XXXA Bitten or stung by nonvenomous insect and other nonvenomous arthropods, initial encounter: Secondary | ICD-10-CM | POA: Diagnosis not present

## 2018-09-28 ENCOUNTER — Telehealth: Payer: Self-pay | Admitting: Cardiovascular Disease

## 2018-09-28 NOTE — Telephone Encounter (Signed)
Pt scheduled for Monday 11am.

## 2018-09-28 NOTE — Telephone Encounter (Signed)
New Message    Patient calling in for appt please call to schedule.

## 2018-10-02 ENCOUNTER — Telehealth: Payer: Self-pay | Admitting: Pharmacist Clinician (PhC)/ Clinical Pharmacy Specialist

## 2018-10-02 NOTE — Telephone Encounter (Signed)
This visit type was conducted due to national recommendations for restrictions regarding the COVID-19 Pandemic (e.g. social distancing) in an effort to limit this patient's exposure and mitigate transmission in our community.  Patient was referred to CVRR clinic to discuss hypertension therapy.  Total time on phone with patient: 19 minutes  Shawn Kent 58 y.o. has a cardiac history significant for hypertension and hyperlipidemia and mild coronary artery stenosis (1-39%).  He also has a family history of CAD, with both his father and paternal grandfather having died post MI.    Current medications for hypertension:  none  Medications tried/failed: none  Family history: father and pgf both had MI  Diet:  Eating out some, trying to support local restaurants in Covid 2-3 times per week; rarely adds salt;  Wife cooks from scratch; some fruits and vegetables, but not overly; mostly chicken, some beef, rare pork or fish  Exercise: no routine exercise, some work around the house; had to cancel boxing class due to covid, is hoping to restart in the next week or two.     Home BP readings:  Patient sent in list of last 24 BP readings thru MyChart.  Range was 122-187/76-98, with an average of 157/83.    Labs:  No recent BMET  Assessment/Plan: Reviewed above information with patient.  He would like to start with improved lifestyle choices before trying medication.  We discussed the need for daily walks at a brisk pace (he usually walks to the woods to bird watch) for at least 20 minutes as well as getting back to boxing as allowed.  He will also cut back on his take out foods and try to get a better mix of fruits/vegetables and proteins, as well as cutting back on carbs.  He was scheduled for follow up, in office on 7/27.  He was given verbal instructions on how to best check his blood pressure and bring those readings, along with his meter, when he comes in at that time.  Shawn Kent PharmD CPP  [facility] at [facility] [address] [place], [place] [address]

## 2018-10-09 NOTE — Telephone Encounter (Signed)
Opened in error

## 2018-11-06 ENCOUNTER — Ambulatory Visit (INDEPENDENT_AMBULATORY_CARE_PROVIDER_SITE_OTHER): Payer: BC Managed Care – PPO | Admitting: Pharmacist

## 2018-11-06 ENCOUNTER — Other Ambulatory Visit: Payer: Self-pay

## 2018-11-06 VITALS — BP 120/68 | HR 56 | Ht 73.0 in | Wt 223.6 lb

## 2018-11-06 DIAGNOSIS — I10 Essential (primary) hypertension: Secondary | ICD-10-CM | POA: Diagnosis not present

## 2018-11-06 DIAGNOSIS — E782 Mixed hyperlipidemia: Secondary | ICD-10-CM | POA: Diagnosis not present

## 2018-11-06 LAB — LIPID PANEL
Chol/HDL Ratio: 2.6 ratio (ref 0.0–5.0)
Cholesterol, Total: 134 mg/dL (ref 100–199)
HDL: 52 mg/dL (ref >39)
LDL Calculated: 67 mg/dL (ref 0–99)
Triglycerides: 76 mg/dL (ref 0–149)
VLDL Cholesterol Cal: 15 mg/dL (ref 5–40)

## 2018-11-06 LAB — HEPATIC FUNCTION PANEL
ALT: 25 IU/L (ref 0–44)
AST: 20 IU/L (ref 0–40)
Albumin: 4.4 g/dL (ref 3.8–4.9)
Alkaline Phosphatase: 67 IU/L (ref 39–117)
Bilirubin Total: 0.4 mg/dL (ref 0.0–1.2)
Bilirubin, Direct: 0.12 mg/dL (ref 0.00–0.40)
Total Protein: 6.5 g/dL (ref 6.0–8.5)

## 2018-11-06 NOTE — Progress Notes (Signed)
Patient ID: Shawn Kent                 DOB: 28-Apr-1960                      MRN: [id number]     HPI:  Shawn Kent is a 58 y.o. male referred by Dr. Gwenlyn Found to HTN clinic. PMH includes hypertension , hyperlipidemia ,and mild coronary artery stenosis (1-39%).  He also has a family history of CAD, with both his father and paternal grandfather having died post MI. During last HTN follow up lifestyle modifications were recommended, and daily BP monitoring. No medication was started or discuss with patient at the time.   Denies swelling, shortness of breath, or dizziness. Suffers from chronic number numbming pain but well under control per patient assessment.   Current HTN meds: none  BP goal: <130/80  Family History: Family history: father and paternal grandfather both had MI  Diet:  Eating out some, trying to support local restaurants in Covid 2-3 times per week; rarely adds salt;  Wife cooks from scratch; some fruits and vegetables, but not overly; mostly chicken, some beef, rare pork or fish  Exercise: 3x/week 45-60 minutes no routine exercise, some work around the house; back on boxing classes.      Home BP readings:  25 readings; average 126/78 (pulse range 50-66bpm)  Intelli-sense (Omron) - accurate within 11mmHg in comparison to manual reading  Wt Readings from Last 3 Encounters:  11/06/18 223 lb 9.6 oz (101.4 kg)  08/10/18 223 lb (101.2 kg)  12/09/17 224 lb (101.6 kg)   BP Readings from Last 3 Encounters:  11/06/18 120/68  12/09/17 120/80  10/19/17 140/84   Pulse Readings from Last 3 Encounters:  11/06/18 (!) 56  08/10/18 66  12/09/17 60    Past Medical History:  Diagnosis Date  . History of chicken pox     Current Outpatient Medications on File Prior to Visit  Medication Sig Dispense Refill  . Coenzyme Q10 (COQ10) 200 MG CAPS Take 1 capsule by mouth daily. 90 capsule 3  . ibuprofen (ADVIL,MOTRIN) 200 MG tablet Take 400 mg by mouth as needed.     .  Ibuprofen-diphenhydrAMINE Cit (ADVIL PM PO) Take 1 tablet by mouth at bedtime.    . rosuvastatin (CRESTOR) 5 MG tablet Take 1 tablet (5 mg total) by mouth daily at 6 PM. Take 1 tablet three days a week (Patient taking differently: Take 5 mg by mouth. Take 1 tablet six days a week.) 90 tablet 3   No current facility-administered medications on file prior to visit.     No Known Allergies  Blood pressure 120/68, pulse (!) 56, height 6\' 1"  (1.854 m), weight 223 lb 9.6 oz (101.4 kg), SpO2 98 %.  Essential hypertension Blood pressure well controlled during OV and at home with home BP average of 126/78. Patient denied problems with lifestyle changes and is back in the gym with boxing classes.   Will continue close monitoring and lifestyle changes. No need for medication at this time. Patient encouraged to monitor BP 2-3 times per week and call back if change in BP noted. Will follow up as needed.     Chesky Heyer Rodriguez-Guzman PharmD, BCPS, [place] [id number] 11/08/2018 1:43 PM

## 2018-11-06 NOTE — Patient Instructions (Signed)
Return for a  follow up appointment in  [place] your blood pressure at home daily (if able) and keep record of the readings.  Take your BP meds as follows: *NO MEDICATION CHANGES* *CONTINUE LIFESTYLE MODIFICATIONS*  *CALL [phone]  IF CHANGES IN BLOOD PRESSURE*  Bring all of your meds, your BP cuff and your record of home blood pressures to your next appointment.  Exercise as you're able, try to walk approximately 30 minutes per day.  Keep salt intake to a minimum, especially watch canned and prepared boxed foods.  Eat more fresh fruits and vegetables and fewer canned items.  Avoid eating in fast food restaurants.    HOW TO TAKE YOUR BLOOD PRESSURE: . Rest 5 minutes before taking your blood pressure. .  Don't smoke or drink caffeinated beverages for at least 30 minutes before. . Take your blood pressure before (not after) you eat. . Sit comfortably with your back supported and both feet on the floor (don't cross your legs). . Elevate your arm to heart level on a table or a desk. . Use the proper sized cuff. It should fit smoothly and snugly around your bare upper arm. There should be enough room to slip a fingertip under the cuff. The bottom edge of the cuff should be 1 inch above the crease of the elbow. . Ideally, take 3 measurements at one sitting and record the average.

## 2018-11-08 ENCOUNTER — Encounter: Payer: Self-pay | Admitting: Pharmacist

## 2018-11-08 NOTE — Assessment & Plan Note (Signed)
Blood pressure well controlled during OV and at home with home BP average of 126/78. Patient denied problems with lifestyle changes and is back in the gym with boxing classes.   Will continue close monitoring and lifestyle changes. No need for medication at this time. Patient encouraged to monitor BP 2-3 times per week and call back if change in BP noted. Will follow up as needed.

## 2018-11-16 ENCOUNTER — Ambulatory Visit (INDEPENDENT_AMBULATORY_CARE_PROVIDER_SITE_OTHER): Payer: BC Managed Care – PPO | Admitting: Physician Assistant

## 2018-11-16 ENCOUNTER — Encounter: Payer: Self-pay | Admitting: Physician Assistant

## 2018-11-16 ENCOUNTER — Other Ambulatory Visit: Payer: Self-pay

## 2018-11-16 VITALS — BP 138/80 | HR 57 | Temp 98.4°F | Resp 16 | Ht 72.5 in | Wt 221.0 lb

## 2018-11-16 DIAGNOSIS — S66912A Strain of unspecified muscle, fascia and tendon at wrist and hand level, left hand, initial encounter: Secondary | ICD-10-CM

## 2018-11-16 NOTE — Patient Instructions (Signed)
Please keep wrist elevated while resting.  Wear brace or compression wrap.  Tylenol or Ibuprofen if needed but since symptoms have already improved, I suspect they will only continue to do so. Would refrain from boxing class/practice over the next 1-2 weeks. A letter has been written.  Please follow-up with [place] or PCP if symptoms are not continuing to resolve.

## 2018-11-16 NOTE — Progress Notes (Signed)
Patient presents to clinic today c/o pain of left wrist first noted late last night/early this morning. Notes boxing (bag only) last night which he does regularly but denies any noted injury. Also notes is dog jumped in the bed last night due to storms and may have pulled something when lifting the dog. Notes more significant pain early this morning that is significantly improved. Denies any bruising, numbness or tingling. Denies decreased ROM but some pain with supination. Denies prior injury to this area.   Past Medical History:  Diagnosis Date  . History of chicken pox     Current Outpatient Medications on File Prior to Visit  Medication Sig Dispense Refill  . Coenzyme Q10 (COQ10) 200 MG CAPS Take 1 capsule by mouth daily. 90 capsule 3  . rosuvastatin (CRESTOR) 5 MG tablet Take 1 tablet (5 mg total) by mouth daily at 6 PM. Take 1 tablet three days a week (Patient taking differently: Take 5 mg by mouth. Take 1 tablet six days a week.) 90 tablet 3   No current facility-administered medications on file prior to visit.     No Known Allergies  Family History  Problem Relation Age of Onset  . Lung cancer Mother   . Leukemia Father   . Skin cancer Paternal Grandmother     Social History   Socioeconomic History  . Marital status: Married    Spouse name: Not on file  . Number of children: Not on file  . Years of education: Not on file  . Highest education level: Not on file  Occupational History  . Not on file  Social Needs  . Financial resource strain: Not on file  . Food insecurity    Worry: Not on file    Inability: Not on file  . Transportation needs    Medical: Not on file    Non-medical: Not on file  Tobacco Use  . Smoking status: Never Smoker  . Smokeless tobacco: Never Used  Substance and Sexual Activity  . Alcohol use: Yes    Alcohol/week: 10.0 standard drinks    Types: 10 Cans of beer per week  . Drug use: Never  . Sexual activity: Yes  Lifestyle  .  Physical activity    Days per week: Not on file    Minutes per session: Not on file  . Stress: Not on file  Relationships  . Social Musicianconnections    Talks on phone: Not on file    Gets together: Not on file    Attends religious service: Not on file    Active member of club or organization: Not on file    Attends meetings of clubs or organizations: Not on file    Relationship status: Not on file  Other Topics Concern  . Not on file  Social History Narrative   Works for Engineer, agriculturalchemical    Two children -- 8128 and 125 y/o   Married    Review of Systems - See HPI.  All other ROS are negative.  BP 138/80   Pulse (!) 57   Temp 98.4 F (36.9 C) (Skin)   Resp 16   Ht 6' 0.5" (1.842 m)   Wt 221 lb (100.2 kg)   SpO2 99%   BMI 29.56 kg/m   Physical Exam Vitals signs reviewed.  Constitutional:      Appearance: Normal appearance. He is not ill-appearing.  HENT:     Head: Normocephalic and atraumatic.  Musculoskeletal:     Left wrist: He  exhibits tenderness (very mild tenderness of lateral wrist with supination and palpation. No bony tenderness noted). He exhibits normal range of motion, no bony tenderness, no swelling, no crepitus and no deformity.     Left hand: Normal. He exhibits normal range of motion. Normal sensation noted. Normal strength noted.     Comments: No snuffbox tenderness on exam  Neurological:     Mental Status: He is alert.     Recent Results (from the past 2160 hour(s))  Lipid panel     Status: None   Collection Time: 11/06/18  9:08 AM  Result Value Ref Range   Cholesterol, Total 134 100 - 199 mg/dL   Triglycerides 76 0 - 149 mg/dL   HDL 52 >39 mg/dL   VLDL Cholesterol Cal 15 5 - 40 mg/dL   LDL Calculated 67 0 - 99 mg/dL   Chol/HDL Ratio 2.6 0.0 - 5.0 ratio    Comment:                                   T. Chol/HDL Ratio                                             Men  Women                               1/2 Avg.Risk  3.4    3.3                                    Avg.Risk  5.0    4.4                                2X Avg.Risk  9.6    7.1                                3X Avg.Risk 23.4   11.0   Hepatic function panel     Status: None   Collection Time: 11/06/18  9:08 AM  Result Value Ref Range   Total Protein 6.5 6.0 - 8.5 g/dL   Albumin 4.4 3.8 - 4.9 g/dL   Bilirubin Total 0.4 0.0 - 1.2 mg/dL   Bilirubin, Direct 0.12 0.00 - 0.40 mg/dL   Alkaline Phosphatase 67 39 - 117 IU/L   AST 20 0 - 40 IU/L   ALT 25 0 - 44 IU/L    Assessment/Plan: 1. Strain of left wrist, initial encounter Very mild. Exam negative for any alarm signs. Normal ROM and sensation. Recommend RICE. Brace recommended. He declines on in office as he notes he has one at home. Tylenol or Ibuprofen if needed for pain. Refraining from gym/boxing training for 2 weeks. Follow-up with PCP if not continuing to improve.    Leeanne Rio, PA-C

## 2019-03-27 ENCOUNTER — Other Ambulatory Visit: Payer: Self-pay | Admitting: Cardiovascular Disease

## 2019-03-27 DIAGNOSIS — E7800 Pure hypercholesterolemia, unspecified: Secondary | ICD-10-CM

## 2019-06-19 ENCOUNTER — Other Ambulatory Visit: Payer: Self-pay | Admitting: Cardiovascular Disease

## 2019-06-19 DIAGNOSIS — E7800 Pure hypercholesterolemia, unspecified: Secondary | ICD-10-CM

## 2019-07-17 ENCOUNTER — Other Ambulatory Visit: Payer: Self-pay | Admitting: Cardiovascular Disease

## 2019-07-17 DIAGNOSIS — E7800 Pure hypercholesterolemia, unspecified: Secondary | ICD-10-CM

## 2019-07-20 DIAGNOSIS — S20461A Insect bite (nonvenomous) of right back wall of thorax, initial encounter: Secondary | ICD-10-CM | POA: Diagnosis not present

## 2019-07-20 DIAGNOSIS — L089 Local infection of the skin and subcutaneous tissue, unspecified: Secondary | ICD-10-CM | POA: Diagnosis not present

## 2019-08-10 ENCOUNTER — Other Ambulatory Visit: Payer: Self-pay | Admitting: Cardiovascular Disease

## 2019-08-10 DIAGNOSIS — E7800 Pure hypercholesterolemia, unspecified: Secondary | ICD-10-CM

## 2019-09-08 ENCOUNTER — Other Ambulatory Visit: Payer: Self-pay | Admitting: Cardiovascular Disease

## 2019-09-08 DIAGNOSIS — E7800 Pure hypercholesterolemia, unspecified: Secondary | ICD-10-CM

## 2019-10-03 ENCOUNTER — Other Ambulatory Visit: Payer: Self-pay | Admitting: Cardiovascular Disease

## 2019-10-03 DIAGNOSIS — E7800 Pure hypercholesterolemia, unspecified: Secondary | ICD-10-CM

## 2019-10-25 IMAGING — CT CT HEART SCORING
2 series · 16 of 20 positions shown, 18 images · non-contrast
Comparison: None.

CLINICAL DATA: Risk stratification

EXAM:
Coronary Calcium Score
TECHNIQUE: The patient was scanned on a Siemens Force scanner. Axial
non-contrast 3 mm slices were carried out through the heart. The
data set was analyzed on a dedicated work station and scored using
the Agatson method.

[Series 2: casc 3.0 i36f 2 bestdiast 69 % · axial · 0.41mm/px · z∈[-264,-156]mm · 8 of 48 slices shown, 10 images]
[im 6/48  vessel]
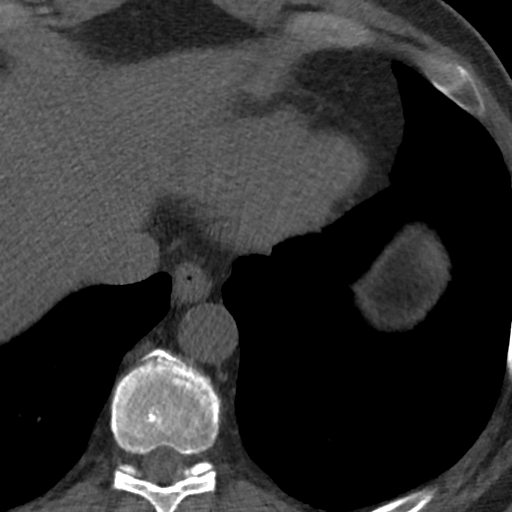
[im 6/48  lung]
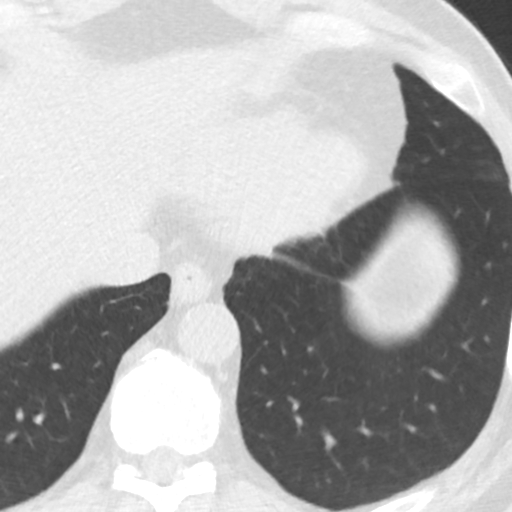
[im 11/48  vessel]
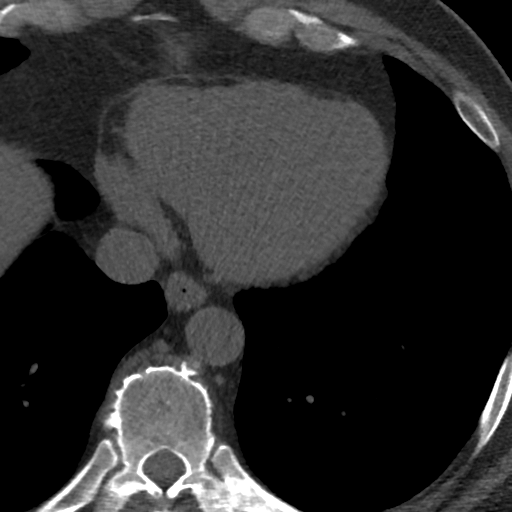
[im 16/48  vessel]
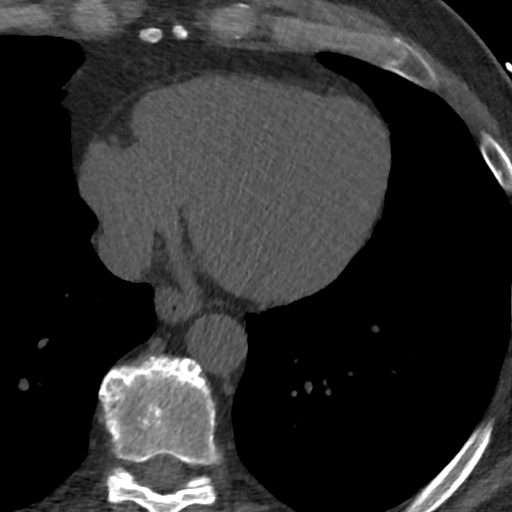
[im 21/48  vessel]
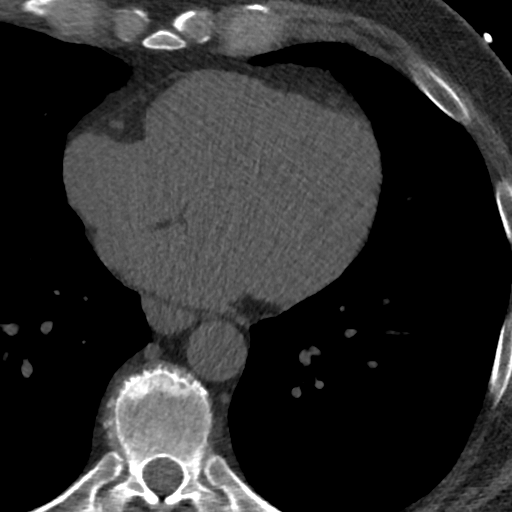
[im 27/48  vessel]
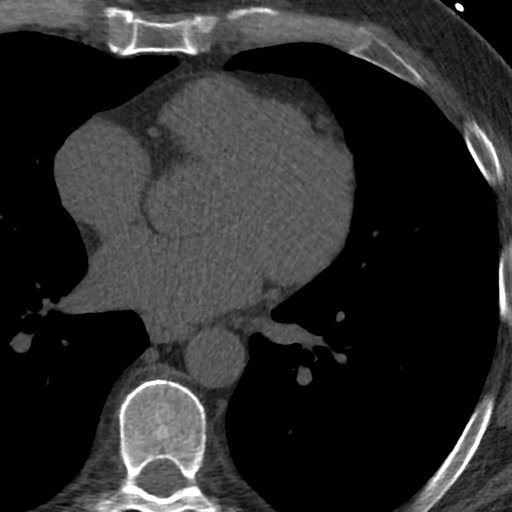
[im 27/48  lung]
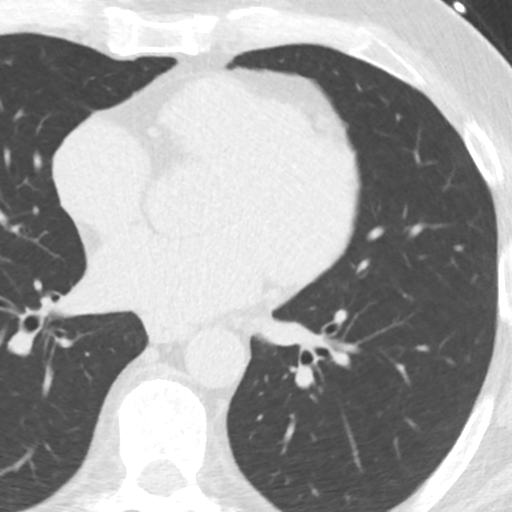
[im 32/48  vessel]
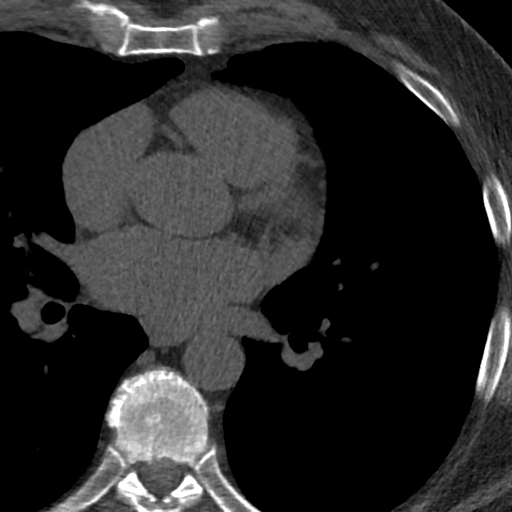
[im 37/48  vessel]
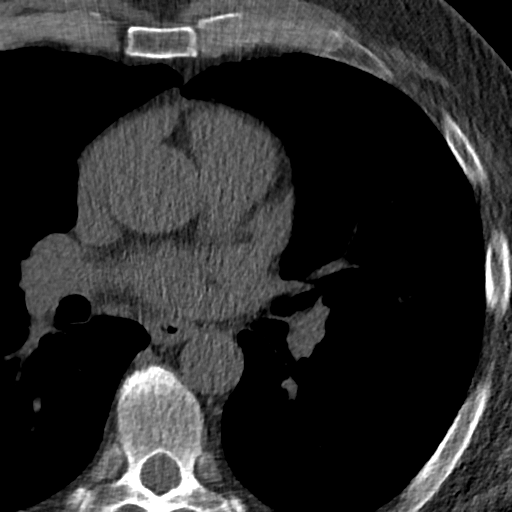
[im 42/48  vessel]
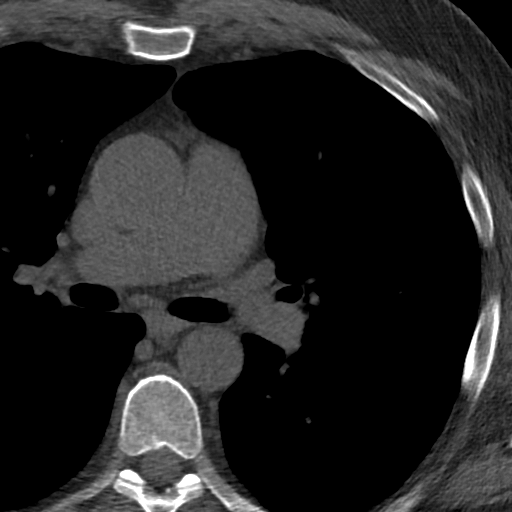

[Series 4: lung st 70 % · axial · 0.74mm/px · z∈[-264,-156]mm · 8 of 48 slices shown]
[im 6/48  lung]
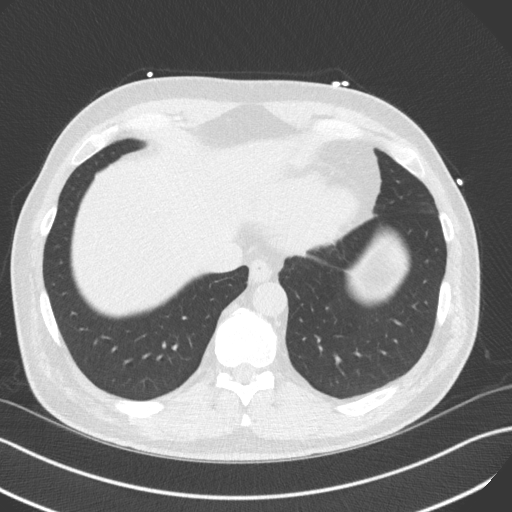
[im 11/48  lung]
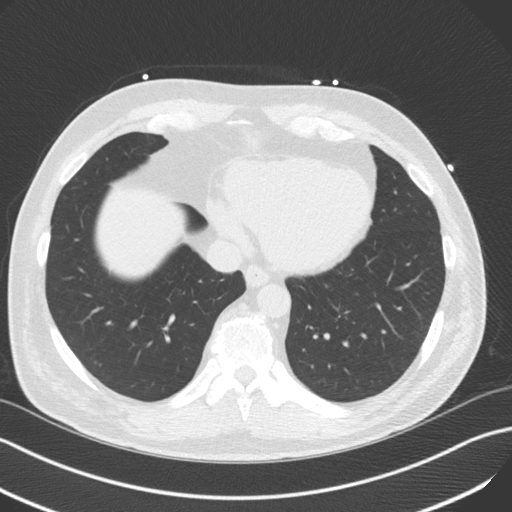
[im 16/48  lung]
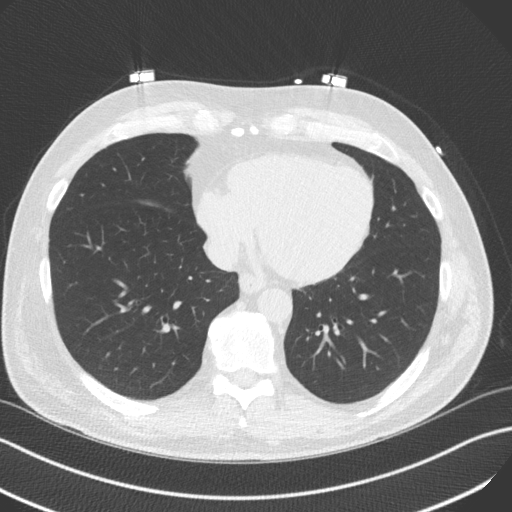
[im 21/48  lung]
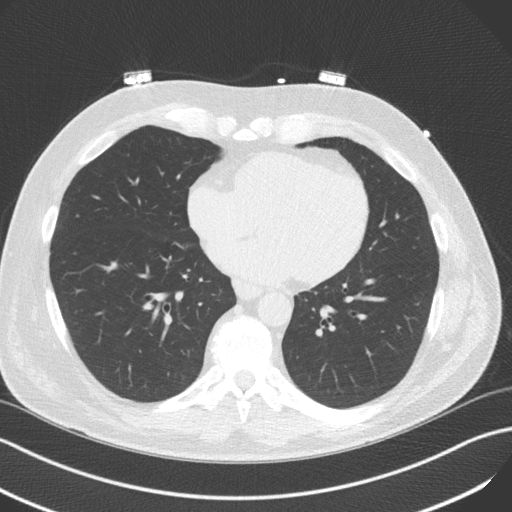
[im 27/48  lung]
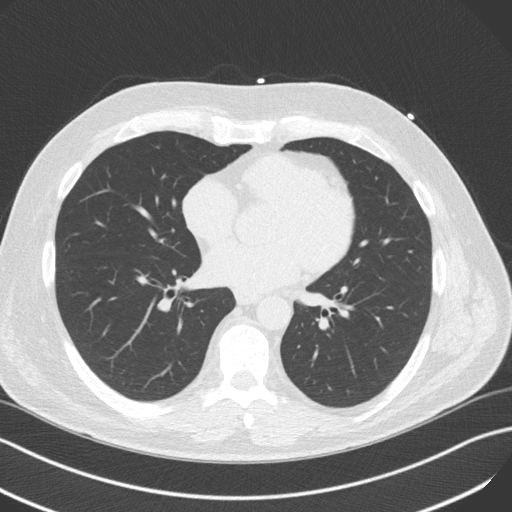
[im 32/48  lung]
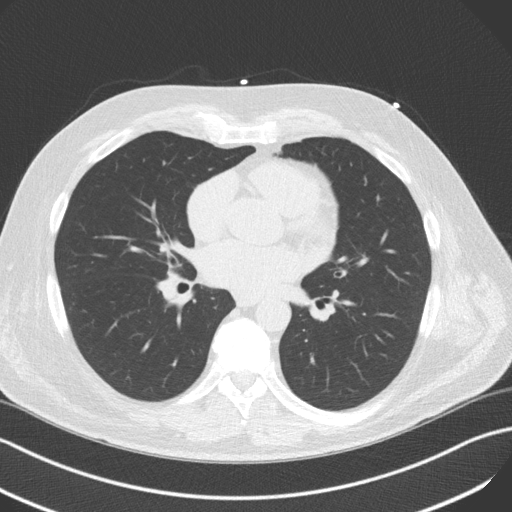
[im 37/48  lung]
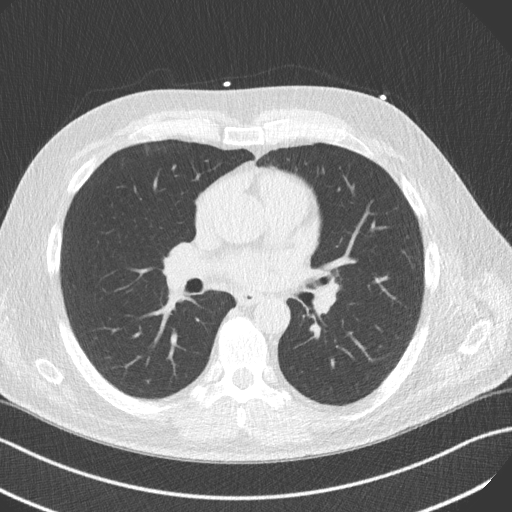
[im 42/48  lung]
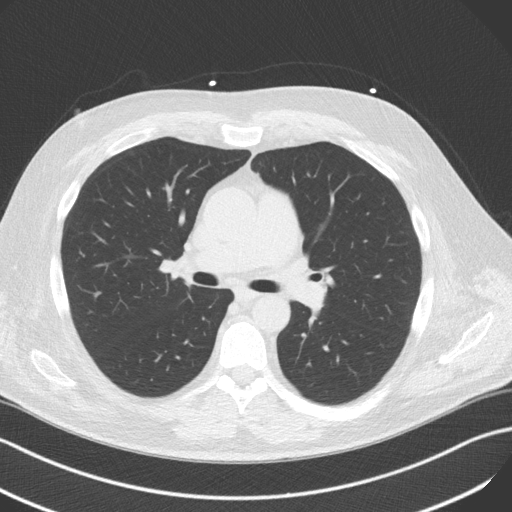

[16 of 20 positions shown; findings below may reference images not displayed]

FINDINGS: Non-cardiac: See separate report from [REDACTED].

Ascending Aorta: Normal size, no calcifications.

Pericardium: Normal.

Coronary arteries: Normal origin.
IMPRESSION: Coronary calcium score of 0. This was 0 percentile for age and sex
matched control.

EXAM:
OVER-READ INTERPRETATION  CT CHEST

The following report is an over-read performed by radiologist Dr.
Sunday Cooling Jackovics [REDACTED] on 12/23/2017. This
over-read does not include interpretation of cardiac or coronary
anatomy or pathology. The coronary calcium score interpretation by
the cardiologist is attached.
FINDINGS: Within the visualized portions of the thorax there are no suspicious
appearing pulmonary nodules or masses, there is no acute
consolidative airspace disease, no pleural effusions, no
pneumothorax and no lymphadenopathy. Visualized portions of the
upper demonstrates mild diffuse low attenuation throughout the
visualized hepatic parenchyma, indicative of hepatic steatosis.
There are no aggressive appearing lytic or blastic lesions noted in
the visualized portions of the skeleton.
IMPRESSION: 1. Hepatic steatosis.

## 2019-10-26 ENCOUNTER — Other Ambulatory Visit: Payer: Self-pay | Admitting: Cardiovascular Disease

## 2019-10-26 ENCOUNTER — Ambulatory Visit (INDEPENDENT_AMBULATORY_CARE_PROVIDER_SITE_OTHER): Payer: Self-pay

## 2019-10-26 ENCOUNTER — Other Ambulatory Visit: Payer: Self-pay

## 2019-10-26 ENCOUNTER — Other Ambulatory Visit: Payer: Self-pay | Admitting: Physician Assistant

## 2019-10-26 DIAGNOSIS — Z0000 Encounter for general adult medical examination without abnormal findings: Secondary | ICD-10-CM

## 2019-10-26 DIAGNOSIS — E7800 Pure hypercholesterolemia, unspecified: Secondary | ICD-10-CM

## 2019-10-26 MED ORDER — ROSUVASTATIN CALCIUM 5 MG PO TABS: 5.0000 mg | ORAL_TABLET | Freq: Every day | ORAL | 0 refills | Status: DC

## 2019-10-26 NOTE — Telephone Encounter (Signed)
*  STAT* If patient is at the pharmacy, call can be transferred to refill team.   1. Which medications need to be refilled? (please list name of each medication and dose if known)  rosuvastatin (CRESTOR) 5 MG tablet  2. Which pharmacy/location (including street and city if local pharmacy) is medication to be sent to? [id number] - [place], [place] - [address]  3. Do they need a 30 day or 90 day supply? 90 day supply

## 2019-10-26 NOTE — Telephone Encounter (Signed)
Rx(s) sent to pharmacy electronically. Patient needs an appointment for 90-day supply.

## 2019-10-30 ENCOUNTER — Other Ambulatory Visit: Payer: Self-pay | Admitting: Cardiovascular Disease

## 2019-10-30 DIAGNOSIS — E7800 Pure hypercholesterolemia, unspecified: Secondary | ICD-10-CM

## 2019-11-06 NOTE — Progress Notes (Signed)
Virtual Visit via Telephone Note   This visit type was conducted due to national recommendations for restrictions regarding the COVID-19 Pandemic (e.g. social distancing) in an effort to limit this patient's exposure and mitigate transmission in our community.  Due to his co-morbid illnesses, this patient is at least at moderate risk for complications without adequate follow up.  This format is felt to be most appropriate for this patient at this time.  The patient did not have access to video technology/had technical difficulties with video requiring transitioning to audio format only (telephone).  All issues noted in this document were discussed and addressed.  No physical exam could be performed with this format.  Please refer to the patient's chart for his  consent to telehealth for [facility].  Evaluation Performed:  Follow-up visit  This visit type was conducted due to national recommendations for restrictions regarding the COVID-19 Pandemic (e.g. social distancing).  This format is felt to be most appropriate for this patient at this time.  All issues noted in this document were discussed and addressed.  No physical exam was performed (except for noted visual exam findings with Video Visits).  Please refer to the patient's chart (MyChart message for video visits and phone note for telephone visits) for the patient's consent to telehealth for [facility]  Date:  11/07/2019   ID:  Shawn Kent, DOB 16-Jan-1961, MRN [id number]  Patient Location:  [address] [place] [address]   Provider location:     [facility] [address] Office [phone] Fax [phone]   PCP:  Jarold Motto, PA  Cardiologist:  Nanetta Batty, MD  Electrophysiologist:  None   Chief Complaint: Follow-up for mixed hyperlipidemia/hypertension  History of Present Illness:    Shawn Kent is a 59 y.o. adult who presents via  audio/video conferencing for a telehealth visit today.  Patient verified DOB and address.  Mr. Lapaglia has a PMH of essential hypertension, HLD, and mild carotid artery disease 1-39%.  He has indicated that he has a strong family history of CAD that has affected both his father and parental grandfather who died post MI.  He was last seen by Dr. Allyson Sabal virtually on 08/10/2018.  During that time he was experiencing some increased stress due to losing his job.  Lifestyle modifications have been recommended for management with his hypertension.  He was seen on 11/06/2018 by cardiology Pharm.D. his blood pressure was well controlled at that time.  He was avoiding sodium and had increased his physical activity.  He denied shortness of breath, dizziness, lower extremity edema.  He did describe some chronic numbness.  He was contacted via virtual platform today and states since he was last seen he has increased his physical activity and enjoys doing twice a week boxing sessions as well as hiking and walking.  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  He has been watching his diet and has been avoiding any "food that comes through a window."  He routinely donates blood and indicates that his blood pressure has been well controlled during these pre-blood assessments.  He feels he is much healthier than he was during his previous visit.  He has no cardiac complaints at this time and plans to continue his diet and physical activity regimen.  I will order a lipid and liver panel, refill his Crestor, and have him follow-up  and 1 year.  Today he denies chest pain, shortness of breath, lower extremity edema, fatigue, palpitations, melena, hematuria, hemoptysis, diaphoresis, weakness, presyncope, syncope, orthopnea, and PND.   The patient does not symptoms concerning for COVID-19 infection (fever, chills, cough, or new SHORTNESS OF BREATH).    Prior CV studies:     The following studies were reviewed today:  EKG 09/09/2017 Normal sinus rhythm 61 bpm   Past Medical History:  Diagnosis Date  . History of chicken pox    Past Surgical History:  Procedure Laterality Date  . CERVICAL FUSION  2017   C 4, 5, 6, 7  . Right Shoulder Surgery Right 2016  . TONSILLECTOMY       Current Meds  Medication Sig  . Coenzyme Q10 (COQ10) 200 MG CAPS Take 1 capsule by mouth daily.  . rosuvastatin (CRESTOR) 5 MG tablet TAKE 1 TABLET BY MOUTH EVERY DAY **NEED OFFICE VISIT**     Allergies:   Patient has no known allergies.   Social History   Tobacco Use  . Smoking status: Never Smoker  . Smokeless tobacco: Never Used  Vaping Use  . Vaping Use: Never used  Substance Use Topics  . Alcohol use: Yes    Alcohol/week: 10.0 standard drinks    Types: 10 Cans of beer per week  . Drug use: Never     Family Hx: The patient's family history includes Leukemia in his father; Lung cancer in his mother; Skin cancer in his paternal grandmother.  ROS:   Please see the history of present illness.     All other systems reviewed and are negative.   Labs/Other Tests and Data Reviewed:    Recent Labs: No results found for requested labs within last 8760 hours.   Recent Lipid Panel Lab Results  Component Value Date/Time   CHOL 134 11/06/2018 09:08 AM   TRIG 76 11/06/2018 09:08 AM   HDL 52 11/06/2018 09:08 AM   CHOLHDL 2.6 11/06/2018 09:08 AM   LDLCALC 67 11/06/2018 09:08 AM    Wt Readings from Last 3 Encounters:  11/07/19 (!) 222 lb (100.7 kg)  11/16/18 221 lb (100.2 kg)  11/06/18 223 lb 9.6 oz (101.4 kg)     Exam:    Vital Signs:  Wt (!) 222 lb (100.7 kg)   BMI 29.69 kg/m    Well nourished, well developed adult in no  acute distress.   ASSESSMENT & PLAN:    1.  Hyperlipidemia/carotid artery disease -LDL 67 on 11/06/2018.  Right ICA and left ICA showed 1-39% bilateral stenosis. Continue co-Q10, rosuvastatin Heart healthy low-sodium high-fiber  diet Increase physical activity as tolerated Order lipid and liver panel   Essential hypertension-BP today unable to obtain.  Well-controlled at home ranging from 118's-120s over 60s 70s Heart healthy low-sodium diet-salty 6 given Increase physical activity as tolerated-continues to exercise 3-4 times per week for around 45 minutes with aerobic activities.  Disposition: Follow-up with Dr. Allyson Sabal or me in 1 year.  COVID-19 Education: The signs and symptoms of COVID-19 were discussed with the patient and how to seek care for testing (follow up with PCP or arrange E-visit).  The importance of social distancing was discussed today.  Patient Risk:   After full review of this patients clinical status, I feel that they are at least moderate risk at this time.  Time:   Today, I have spent 12 minutes with the patient with telehealth technology discussing over 20 minutes spent reviewing patient's previous chart, labs, and  testing.     Medication Adjustments/Labs and Tests Ordered: Current medicines are reviewed at length with the patient today.  Concerns regarding medicines are outlined above.   Tests Ordered: No orders of the defined types were placed in this encounter.  Medication Changes: No orders of the defined types were placed in this encounter.   Disposition:  in 1 year(s)  Signed, Thomasene Ripple. Leonetta Mcgivern NP-C    11/14/2018 11:58 AM    [facility] [address] Office [phone] Fax [phone]

## 2019-11-07 ENCOUNTER — Encounter: Payer: Self-pay | Admitting: General Practice

## 2019-11-07 ENCOUNTER — Telehealth (INDEPENDENT_AMBULATORY_CARE_PROVIDER_SITE_OTHER): Payer: BC Managed Care – PPO | Admitting: General Practice

## 2019-11-07 VITALS — Wt 222.0 lb

## 2019-11-07 DIAGNOSIS — Z79899 Other long term (current) drug therapy: Secondary | ICD-10-CM

## 2019-11-07 DIAGNOSIS — E782 Mixed hyperlipidemia: Secondary | ICD-10-CM | POA: Diagnosis not present

## 2019-11-07 DIAGNOSIS — E7800 Pure hypercholesterolemia, unspecified: Secondary | ICD-10-CM

## 2019-11-07 DIAGNOSIS — I10 Essential (primary) hypertension: Secondary | ICD-10-CM

## 2019-11-07 MED ORDER — ROSUVASTATIN CALCIUM 5 MG PO TABS: ORAL_TABLET | ORAL | 12 refills | Status: DC

## 2019-11-07 NOTE — Addendum Note (Signed)
Addended by: Alyson Ingles on: 11/07/2019 10:39 AM   Modules accepted: Orders

## 2019-11-07 NOTE — Patient Instructions (Signed)
Medication Instructions:  The current medical regimen is effective;  continue present plan and medications as directed. Please refer to the Current Medication list given to you today. *If you need a refill on your cardiac medications before your next appointment, please call your pharmacy*  Lab Work: FASTING LIPID AND LFT HERE IN OUR OFFICE LAB WITHIN 1-2 WEEKS If you have labs (blood work) drawn today and your tests are completely normal, you will receive your results only by:  MyChart Message (if you have MyChart) OR A paper copy in the mail.  If you have any lab test that is abnormal or we need to change your treatment, we will call you to review the results. You may go to any [facility] that is convenient for you however, we do have a lab in our office that is able to assist you. You DO NOT need an appointment for our lab. The lab is open 8:00am and closes at 4:00pm. Lunch 12:45 - 1:45pm.  Special Instructions PLEASE READ AND FOLLOW SALTY 6-ATTACHED   Follow-Up: Your next appointment:  12 month(s) Please call our office 2 months in advance to schedule this appointment In Person with You may see Nanetta Batty, MD or one of the following Advanced Practice Providers on your designated Care Team:  Edd Fabian, FNP  Corine Shelter, PA-C  Marjie Skiff, PA-C  At [facility], you and your health needs are our priority.  As part of our continuing mission to provide you with exceptional heart care, we have created designated Provider Care Teams.  These Care Teams include your primary Cardiologist (physician) and Advanced Practice Providers (APPs -  Physician Assistants and Nurse Practitioners) who all work together to provide you with the care you need, when you need it.

## 2019-11-15 DIAGNOSIS — Z79899 Other long term (current) drug therapy: Secondary | ICD-10-CM | POA: Diagnosis not present

## 2019-11-15 DIAGNOSIS — E782 Mixed hyperlipidemia: Secondary | ICD-10-CM | POA: Diagnosis not present

## 2019-11-15 DIAGNOSIS — I10 Essential (primary) hypertension: Secondary | ICD-10-CM | POA: Diagnosis not present

## 2019-11-16 LAB — HEPATIC FUNCTION PANEL
ALT: 29 IU/L (ref 0–44)
AST: 24 IU/L (ref 0–40)
Albumin: 4.8 g/dL (ref 3.8–4.9)
Alkaline Phosphatase: 71 IU/L (ref 48–121)
Bilirubin Total: 0.6 mg/dL (ref 0.0–1.2)
Bilirubin, Direct: 0.16 mg/dL (ref 0.00–0.40)
Total Protein: 7 g/dL (ref 6.0–8.5)

## 2019-11-26 ENCOUNTER — Other Ambulatory Visit: Payer: Self-pay

## 2019-11-26 DIAGNOSIS — I10 Essential (primary) hypertension: Secondary | ICD-10-CM

## 2019-11-26 DIAGNOSIS — E782 Mixed hyperlipidemia: Secondary | ICD-10-CM

## 2019-11-26 DIAGNOSIS — Z79899 Other long term (current) drug therapy: Secondary | ICD-10-CM

## 2019-11-30 DIAGNOSIS — I10 Essential (primary) hypertension: Secondary | ICD-10-CM | POA: Diagnosis not present

## 2019-11-30 DIAGNOSIS — Z79899 Other long term (current) drug therapy: Secondary | ICD-10-CM | POA: Diagnosis not present

## 2019-11-30 DIAGNOSIS — E782 Mixed hyperlipidemia: Secondary | ICD-10-CM | POA: Diagnosis not present

## 2019-11-30 LAB — LIPID PANEL
Chol/HDL Ratio: 2.7 ratio (ref 0.0–5.0)
Cholesterol, Total: 149 mg/dL (ref 100–199)
HDL: 55 mg/dL (ref >39)
LDL Chol Calc (NIH): 76 mg/dL (ref 0–99)
Triglycerides: 96 mg/dL (ref 0–149)
VLDL Cholesterol Cal: 18 mg/dL (ref 5–40)

## 2020-04-20 DIAGNOSIS — Z1152 Encounter for screening for COVID-19: Secondary | ICD-10-CM | POA: Diagnosis not present

## 2020-11-20 ENCOUNTER — Other Ambulatory Visit: Payer: Self-pay | Admitting: General Practice

## 2020-11-20 DIAGNOSIS — E7800 Pure hypercholesterolemia, unspecified: Secondary | ICD-10-CM

## 2020-12-16 ENCOUNTER — Other Ambulatory Visit: Payer: Self-pay | Admitting: Cardiovascular Disease

## 2020-12-16 DIAGNOSIS — E7800 Pure hypercholesterolemia, unspecified: Secondary | ICD-10-CM

## 2021-01-09 ENCOUNTER — Other Ambulatory Visit: Payer: Self-pay | Admitting: Cardiovascular Disease

## 2021-01-09 DIAGNOSIS — E7800 Pure hypercholesterolemia, unspecified: Secondary | ICD-10-CM

## 2021-02-05 ENCOUNTER — Other Ambulatory Visit: Payer: Self-pay | Admitting: Cardiovascular Disease

## 2021-02-05 DIAGNOSIS — E7800 Pure hypercholesterolemia, unspecified: Secondary | ICD-10-CM

## 2021-03-05 ENCOUNTER — Other Ambulatory Visit: Payer: Self-pay | Admitting: Cardiovascular Disease

## 2021-03-05 DIAGNOSIS — E7800 Pure hypercholesterolemia, unspecified: Secondary | ICD-10-CM

## 2021-03-22 ENCOUNTER — Other Ambulatory Visit: Payer: Self-pay | Admitting: Cardiovascular Disease

## 2021-03-22 DIAGNOSIS — E7800 Pure hypercholesterolemia, unspecified: Secondary | ICD-10-CM

## 2021-04-06 ENCOUNTER — Other Ambulatory Visit: Payer: Self-pay | Admitting: Cardiovascular Disease

## 2021-04-06 DIAGNOSIS — E7800 Pure hypercholesterolemia, unspecified: Secondary | ICD-10-CM

## 2021-04-14 ENCOUNTER — Other Ambulatory Visit: Payer: Self-pay | Admitting: Cardiovascular Disease

## 2021-04-14 DIAGNOSIS — E7800 Pure hypercholesterolemia, unspecified: Secondary | ICD-10-CM

## 2021-04-20 ENCOUNTER — Other Ambulatory Visit: Payer: Self-pay | Admitting: Cardiovascular Disease

## 2021-04-20 DIAGNOSIS — E7800 Pure hypercholesterolemia, unspecified: Secondary | ICD-10-CM

## 2021-04-24 ENCOUNTER — Encounter: Payer: Self-pay | Admitting: Cardiovascular Disease

## 2021-04-24 ENCOUNTER — Ambulatory Visit (INDEPENDENT_AMBULATORY_CARE_PROVIDER_SITE_OTHER): Payer: BC Managed Care – PPO | Admitting: Cardiovascular Disease

## 2021-04-24 ENCOUNTER — Other Ambulatory Visit: Payer: Self-pay

## 2021-04-24 DIAGNOSIS — I10 Essential (primary) hypertension: Secondary | ICD-10-CM | POA: Diagnosis not present

## 2021-04-24 DIAGNOSIS — E782 Mixed hyperlipidemia: Secondary | ICD-10-CM

## 2021-04-24 MED ORDER — ROSUVASTATIN CALCIUM 5 MG PO TABS: ORAL_TABLET | ORAL | 4 refills | Status: DC

## 2021-04-24 NOTE — Assessment & Plan Note (Signed)
History of hyperlipidemia on low-dose rosuvastatin with lipid profile performed 11/30/2019 revealing a total cholesterol 149, LDL 76 and HDL 55.  I am going to recheck a lipid liver profile.

## 2021-04-24 NOTE — Patient Instructions (Signed)
Medication Instructions:  Your physician recommends that you continue on your current medications as directed. Please refer to the Current Medication list given to you today.  *If you need a refill on your cardiac medications before your next appointment, please call your pharmacy*   Lab Work: Your physician recommends that you have labs drawn today: Lipid/Liver profile.  If you have labs (blood work) drawn today and your tests are completely normal, you will receive your results only by: [place] (if you have MyChart) OR A paper copy in the mail If you have any lab test that is abnormal or we need to change your treatment, we will call you to review the results.   Follow-Up: At [facility], you and your health needs are our priority.  As part of our continuing mission to provide you with exceptional heart care, we have created designated Provider Care Teams.  These Care Teams include your primary Cardiologist (physician) and Advanced Practice Providers (APPs -  Physician Assistants and Nurse Practitioners) who all work together to provide you with the care you need, when you need it.  We recommend signing up for the patient portal called "MyChart".  Sign up information is provided on this After Visit Summary.  MyChart is used to connect with patients for Virtual Visits (Telemedicine).  Patients are able to view lab/test results, encounter notes, upcoming appointments, etc.  Non-urgent messages can be sent to your provider as well.   To learn more about what you can do with MyChart, go to [email or web address].    Your next appointment:   2 year(s)  The format for your next appointment:   In Person  Provider:   Quay Burow, MD

## 2021-04-24 NOTE — Progress Notes (Signed)
04/24/2021 [name]   Aug 14, 1960  [id number]  Primary Physician Inda Coke, PA Primary Cardiologist: Lorretta Harp MD [name], [name]  HPI:  Shawn Kent is a 61 y.o.   mildly overweight married Caucasian male father of 2 children who I last saw i last saw for a virtual telemedicine visit 08/10/2018.  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  He is initially from [place] and lived in [place] as well.  He has 2 masters degrees.  He is referred by Inda Coke, PA for cardiovascular evaluation because of family history of heart disease plaque in his right carotid artery by duplex ultrasound.  His risk factors include family history with a father who had his first myocardial infarction in his 70s.  He does have mild hyperlipidemia with an LDL of 119 and mild hypertension as well by readings in doctor's offices recently.  He is never had a heart attack or stroke.  He denies chest pain or shortness of breath.  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 He had carotid Dopplers performed in our office 09/19/2017 which were in entirely normal.   Since I saw him close to a year ago he is remained stable.  I did carotid Doppler studies on him which were normal.  A coronary calcium score was 0.  He works out doing Dietitian 2 days a week and is fairly active.  He denies chest pain or shortness of breath.  He is on low-dose Crestor with a lipid profile performed 11/30/2019 revealing a total cholesterol 149, LDL 76 and HDL 55.   Current Meds  Medication Sig   ibuprofen (ADVIL) 200 MG tablet Take by mouth as needed.   [DISCONTINUED] rosuvastatin (CRESTOR) 5 MG tablet TAKE 1 TABLET BY MOUTH DAILY PLEASE MAKE APPT     No Known Allergies  Social History    Socioeconomic History   Marital status: Married    Spouse name: Not on file   Number of children: Not on file   Years of education: Not on file   Highest education level: Not on file  Occupational History   Not on file  Tobacco Use   Smoking status: Never   Smokeless tobacco: Never  Vaping Use   Vaping Use: Never used  Substance and Sexual Activity   Alcohol use: Yes    Alcohol/week: 10.0 standard drinks    Types: 10 Cans of beer per week   Drug use: Never   Sexual activity: Yes  Other Topics Concern   Not on file  Social History Narrative   Works for [employer]    Two children -- 76 and 78 y/o   Married   Social Determinants of Radio broadcast assistant Strain: Not on file  Food Insecurity: Not on file  Transportation Needs: Not on file  Physical Activity: Not on file  Stress: Not on file  Social Connections: Not on file  Intimate Partner Violence: Not on file     Review of Systems: General: negative for chills, fever, night sweats or weight changes.  Cardiovascular: negative for chest pain, dyspnea on exertion, edema, orthopnea, palpitations, paroxysmal nocturnal dyspnea or shortness of breath Dermatological: negative for rash Respiratory: negative for cough or wheezing Urologic: negative for hematuria Abdominal:  negative for nausea, vomiting, diarrhea, bright red blood per rectum, melena, or hematemesis Neurologic: negative for visual changes, syncope, or dizziness All other systems reviewed and are otherwise negative except as noted above.    Blood pressure (!) 162/92, pulse (!) 53, height 6\' 1"  (1.854 m), weight 225 lb 6.4 oz (102.2 kg), SpO2 97 %.  General appearance: alert and no distress Neck: no adenopathy, no carotid bruit, no JVD, supple, symmetrical, trachea midline, and thyroid not enlarged, symmetric, no tenderness/mass/nodules Lungs: clear to auscultation bilaterally Heart: regular rate and rhythm, S1, S2 normal, no murmur, click, rub or  gallop Extremities: extremities normal, atraumatic, no cyanosis or edema Pulses: 2+ and symmetric Skin: Skin color, texture, turgor normal. No rashes or lesions Neurologic: Grossly normal  EKG sinus bradycardia at 53 without ST or T wave changes.  I personally reviewed this EKG.  ASSESSMENT AND PLAN:   Hyperlipidemia History of hyperlipidemia on low-dose rosuvastatin with lipid profile performed 11/30/2019 revealing a total cholesterol 149, LDL 76 and HDL 55.  I am going to recheck a lipid liver profile.  Essential hypertension History of essential hypertension with blood pressure measured today at 162/92.  He is not on antihypertensive medications and he says that when he gets blood every 8 weeks his blood pressure is usually in the 130/70 range.     Lorretta Harp MD FACP,FACC,FAHA, [facility] 04/24/2021 12:05 PM

## 2021-04-24 NOTE — Assessment & Plan Note (Signed)
History of essential hypertension with blood pressure measured today at 162/92.  He is not on antihypertensive medications and he says that when he gets blood every 8 weeks his blood pressure is usually in the 130/70 range.

## 2021-04-25 LAB — LIPID PANEL
Chol/HDL Ratio: 2.6 ratio (ref 0.0–5.0)
Cholesterol, Total: 166 mg/dL (ref 100–199)
HDL: 63 mg/dL (ref >39)
LDL Chol Calc (NIH): 90 mg/dL (ref 0–99)
Triglycerides: 69 mg/dL (ref 0–149)
VLDL Cholesterol Cal: 13 mg/dL (ref 5–40)

## 2021-04-25 LAB — HEPATIC FUNCTION PANEL
ALT: 27 IU/L (ref 0–44)
AST: 26 IU/L (ref 0–40)
Albumin: 4.7 g/dL (ref 3.8–4.9)
Alkaline Phosphatase: 69 IU/L (ref 44–121)
Bilirubin Total: 0.6 mg/dL (ref 0.0–1.2)
Bilirubin, Direct: 0.18 mg/dL (ref 0.00–0.40)
Total Protein: 6.8 g/dL (ref 6.0–8.5)

## 2021-06-09 DIAGNOSIS — R152 Fecal urgency: Secondary | ICD-10-CM | POA: Diagnosis not present

## 2021-06-09 DIAGNOSIS — R197 Diarrhea, unspecified: Secondary | ICD-10-CM | POA: Diagnosis not present

## 2021-06-09 DIAGNOSIS — Z86010 Personal history of colonic polyps: Secondary | ICD-10-CM | POA: Diagnosis not present

## 2021-08-27 IMAGING — DX DG CHEST 2V
2 series · 2 of 2 positions shown · non-contrast
Comparison: None.

CLINICAL DATA: Annual employment exam.

EXAM:
CHEST - 2 VIEW

[chest pa]
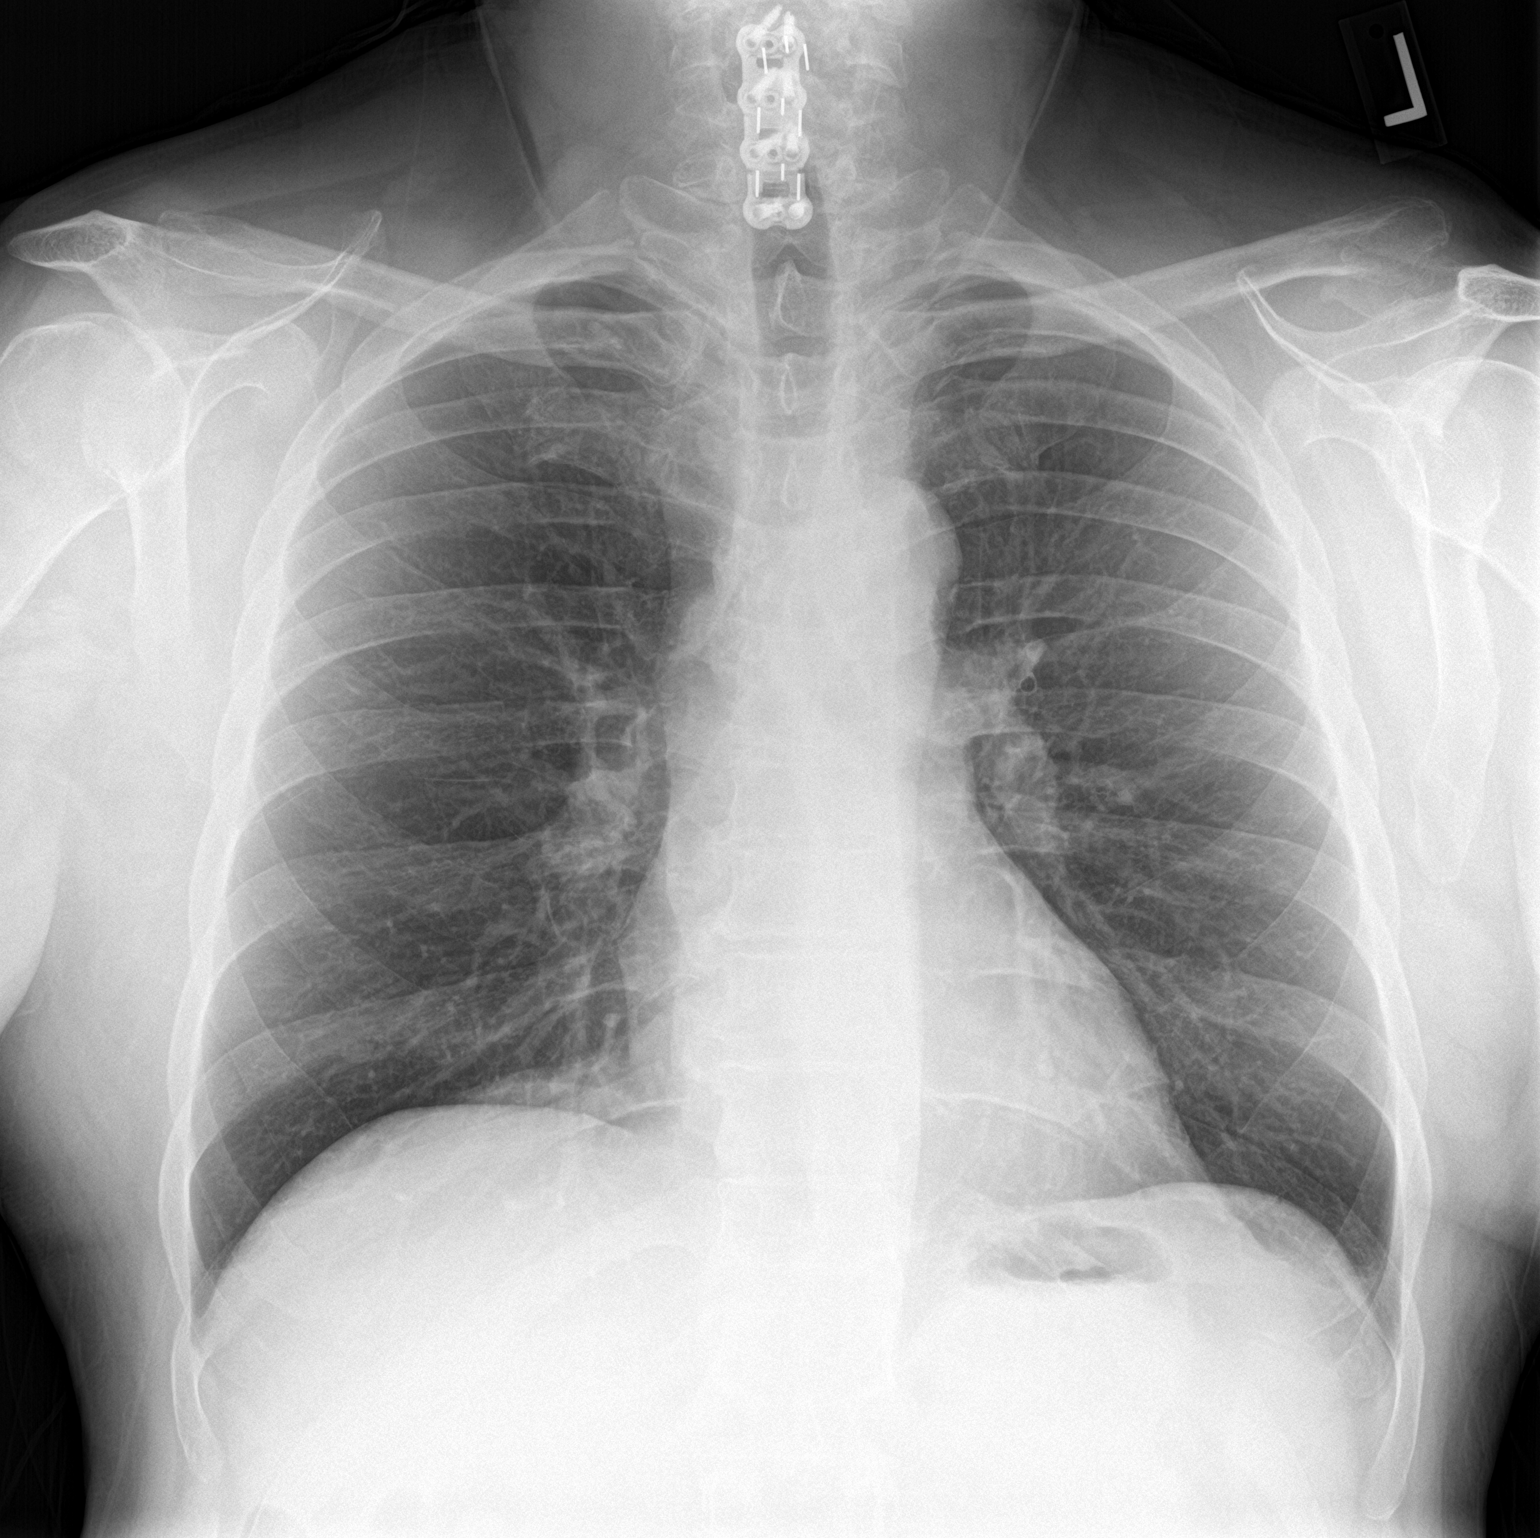

[chest lat]
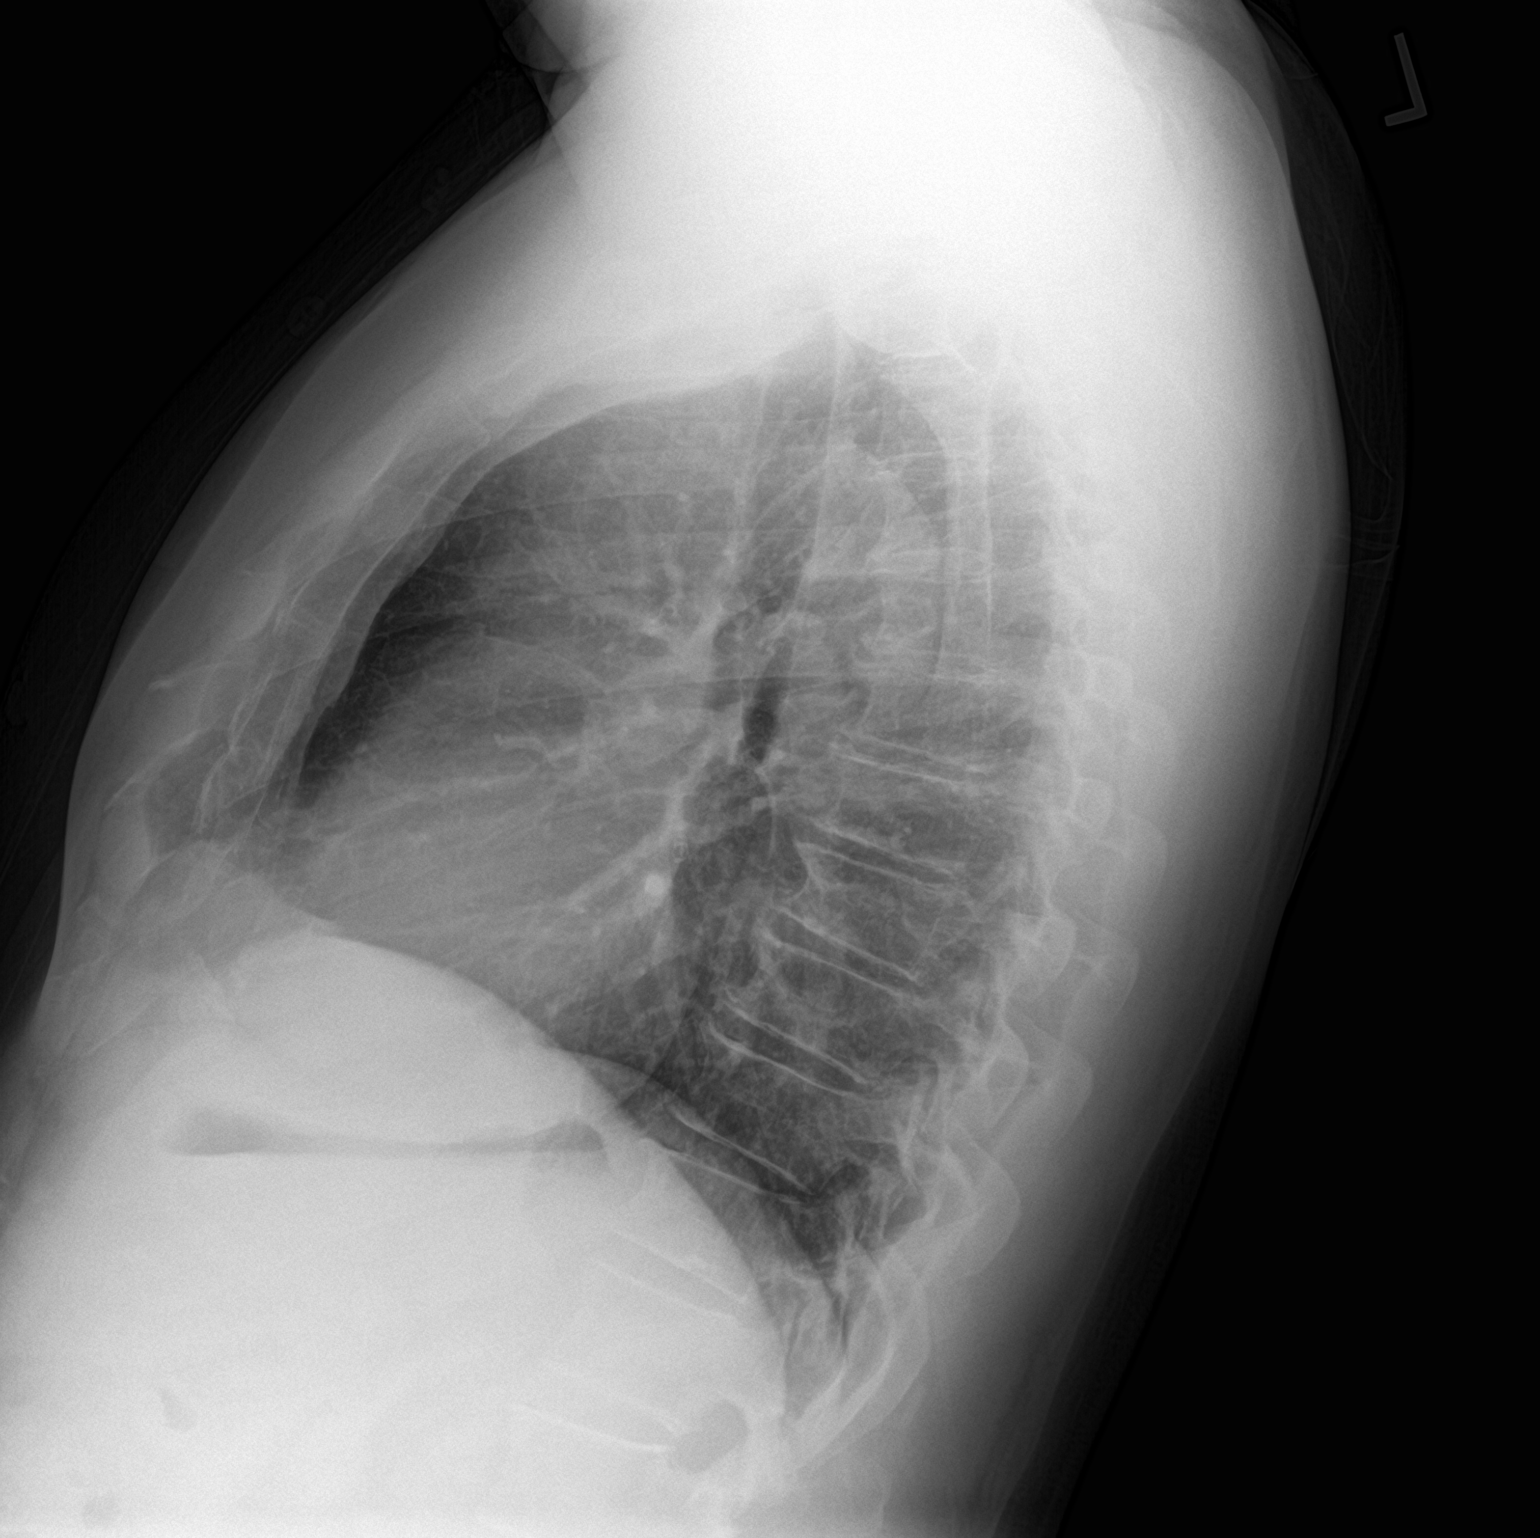

[2 of 2 positions shown; findings below may reference images not displayed]

FINDINGS: The heart size and mediastinal contours are within normal limits.
Both lungs are clear. The visualized skeletal structures are
unremarkable.
IMPRESSION: No active cardiopulmonary disease.

## 2022-01-04 ENCOUNTER — Encounter: Payer: Self-pay | Admitting: *Deleted

## 2022-07-14 ENCOUNTER — Other Ambulatory Visit: Payer: Self-pay | Admitting: Cardiovascular Disease

## 2022-07-14 DIAGNOSIS — E7800 Pure hypercholesterolemia, unspecified: Secondary | ICD-10-CM

## 2022-09-17 ENCOUNTER — Telehealth: Payer: Self-pay | Admitting: Cardiovascular Disease

## 2022-09-17 NOTE — Telephone Encounter (Signed)
Patient states running high for a while.  States he went to give blood in May and they refused due to his high BP.  He states 190/100 "or something".  He states using arm BP monitor at home and was "similar number 185/105".  He cannot give exact numbers.  States he has been having dizzy spells and attributed to taking a lot of Advil due to back pain.  He states that now he thinks it may be to BP elevated.  Appt was made for July, but found available spot June 17.  He is aware of date, time, location

## 2022-09-17 NOTE — Telephone Encounter (Signed)
Pt c/o BP issue: STAT if pt c/o blurred vision, one-sided weakness or slurred speech  1. What are your last 5 BP readings? 190/100-patient says his blood pressure have been running high, did not have any other readings  2. Are you having any other symptoms (ex. Dizziness, headache, blurred vision, passed out)? Dizzy for a while, but not at this time  3. What is your BP issue? High blood pressure- patient wanted to be seen-made appointment for 10-11-22. Please call patient to evaluate

## 2022-09-26 NOTE — Progress Notes (Unsigned)
Cardiology Clinic Note   Date: 09/27/2022 ID: [name], DOB 1961-04-09, MRN [id number]  Primary Cardiologist:  Nanetta Batty, MD  Patient Profile    Shawn Kent is a 62 y.o. adult who presents to the clinic today for recent hypertension.     Past medical history significant for: Hypertension. Hyperlipidemia. CT cardiac scoring 12/25/2017: Coronary calcium score of 0. Lipid panel 04/24/2021: LDL 90, HDL 63, TG 69, total 166. Carotid artery disease. Carotid ultrasound 09/19/2017: 1 to 39% bilateral ICA.     History of Present Illness    Shawn Kent was first evaluated by Dr. Allyson Sabal on 09/09/2017 to establish care secondary to family history of heart disease and plaque in right carotid artery at the request of Shawn Motto, PA-C.  Carotid ultrasound showed 1-39% stenosis bilateral ICA.  Coronary calcium score in September 2019 was 0.  Patient was last seen in the office by Dr. Allyson Sabal on 04/24/2021 for follow-up.  He was doing well at that time.  He was hypertensive in the office but reported BP checks every 8 weeks are usually in the range of 130/70.  Patient contacted the office on 09/17/2022 with complaints of elevated BP and periodic dizziness.  Per conversation with triage: "Patient states running high for a while. States he went to give blood in May and they refused due to his high BP. He states 190/100 "or something". He states using arm BP monitor at home and was "similar number 185/105". He cannot give exact numbers. States he has been having dizzy spells and attributed to taking a lot of Advil due to back pain. He states that now he thinks it may be to BP elevated."  Today, patient is here alone. He reports continued elevated BP with home reading 180-190/80-100. He denies headaches or vision changes. He describes five days of dizzy spells both while seated and with position changes but none recently.  He stopped working out in November 2023 secondary to the boxing gym  he attended closing. He remains somewhat active with walking, as he likes to bird watch. He had a flare of sciatic/back pain in April for which he was taking Advil "like candy" to manage pain. He has since reduce that to just 1.5 tablets of Advil PM at night. He states he is very concerned about BP, as his wife had a hemorrhagic stroke in March. He does a lot of local traveling for his job which causes him to eat a lot of fast food (approximately 10 times a month). He does not add salt to his food. His wife does most of the cooking.  He has a 16 oz cup of strong coffee each morning. He would prefer to not take medication.      ROS: All other systems reviewed and are otherwise negative except as noted in History of Present Illness.  Studies Reviewed    ECG personally reviewed by me today: NSR, 60 bpm.  No significant changes from 04/24/2021.  Risk Assessment/Calculations      HYPERTENSION CONTROL Vitals:   09/27/22 0821 09/27/22 1125  BP: (!) 188/88 (!) 190/90    The patient's blood pressure is elevated above target today.  In order to address the patient's elevated BP: A new medication was prescribed today.           Physical Exam    VS:  BP (!) 190/90 (BP Location: Left Arm, Patient Position: Sitting, Cuff Size: Normal)   Pulse 60   Ht 6\' 1"  (1.854 m)  Wt 221 lb 12.8 oz (100.6 kg)   BMI 29.26 kg/m  , BMI Body mass index is 29.26 kg/m.  GEN: Well nourished, well developed, in no acute distress. Neck: No JVD or carotid bruits. Cardiac:  RRR. No murmurs. No rubs or gallops.   Respiratory:  Respirations regular and unlabored. Clear to auscultation without rales, wheezing or rhonchi. GI: Soft, nontender, nondistended. Extremities: Radials/DP/PT 2+ and equal bilaterally. No clubbing or cyanosis. No edema.  Skin: Warm and dry, no rash. Neuro: Strength intact.  Assessment & Plan    Hypertension. BP today 188/88 on intake and 190/90 on my recheck. Patient denies headaches or  vision changes. He had five days of dizzy spells both while seated and with position changes but none recently. Home BP 180-190/80-100. Diet consists of a lot of fast food (approximately 10 times a month) secondary to frequent local travel for work. He does not add salt to his food. His wife does most of the cooking. He has not done formal exercise since his gym closed in November. He does walk to bird watch. Will start Losartan 25 mg daily. Provided Salty 6 information sheet. He will MyChart 2 weeks BP log. Will get repeat BMP in 2 weeks.  Hyperlipidemia.  LDL January 2023 90, at goal.  Continue atorvastatin.  Will get repeat lipid panel and CMP.  Disposition: Losartan 25 mg daily. CMP and lipid panel today. MyChart 2 week BP log. BMP in 2 weeks. Return in 8-10 weeks or sooner as needed.          Signed, Shawn Kent. Sherline Eberwein, DNP, NP-C

## 2022-09-27 ENCOUNTER — Encounter: Payer: Self-pay | Admitting: Student

## 2022-09-27 ENCOUNTER — Ambulatory Visit: Payer: BC Managed Care – PPO | Attending: Student | Admitting: Student

## 2022-09-27 VITALS — BP 190/90 | HR 60 | Ht 73.0 in | Wt 221.8 lb

## 2022-09-27 DIAGNOSIS — I10 Essential (primary) hypertension: Secondary | ICD-10-CM | POA: Diagnosis not present

## 2022-09-27 DIAGNOSIS — Z79899 Other long term (current) drug therapy: Secondary | ICD-10-CM | POA: Diagnosis not present

## 2022-09-27 DIAGNOSIS — E782 Mixed hyperlipidemia: Secondary | ICD-10-CM | POA: Diagnosis not present

## 2022-09-27 LAB — LIPID PANEL
Chol/HDL Ratio: 3 ratio (ref 0.0–5.0)
Cholesterol, Total: 170 mg/dL (ref 100–199)
HDL: 57 mg/dL (ref >39)
LDL Chol Calc (NIH): 96 mg/dL (ref 0–99)
Triglycerides: 92 mg/dL (ref 0–149)
VLDL Cholesterol Cal: 17 mg/dL (ref 5–40)

## 2022-09-27 LAB — COMPREHENSIVE METABOLIC PANEL
ALT: 26 IU/L (ref 0–44)
AST: 23 IU/L (ref 0–40)
Albumin: 4.6 g/dL (ref 3.8–4.9)
Alkaline Phosphatase: 75 IU/L (ref 44–121)
BUN/Creatinine Ratio: 17 (ref 10–24)
BUN: 22 mg/dL (ref 8–27)
Bilirubin Total: 0.6 mg/dL (ref 0.0–1.2)
CO2: 24 mmol/L (ref 20–29)
Calcium: 9.2 mg/dL (ref 8.6–10.2)
Chloride: 107 mmol/L — ABNORMAL HIGH (ref 96–106)
Creatinine, Ser: 1.31 mg/dL — ABNORMAL HIGH (ref 0.76–1.27)
Globulin, Total: 2.2 g/dL (ref 1.5–4.5)
Glucose: 106 mg/dL — ABNORMAL HIGH (ref 70–99)
Potassium: 4.7 mmol/L (ref 3.5–5.2)
Sodium: 143 mmol/L (ref 134–144)
Total Protein: 6.8 g/dL (ref 6.0–8.5)
eGFR: 62 mL/min/{1.73_m2} (ref >59)

## 2022-09-27 MED ORDER — LOSARTAN POTASSIUM 25 MG PO TABS
25.0000 mg | ORAL_TABLET | Freq: Every day | ORAL | 3 refills | Status: DC
Start: 2022-09-27 — End: 2022-11-22

## 2022-09-27 NOTE — Patient Instructions (Addendum)
Medication Instructions:   START Losartan 25 mg daily   *If you need a refill on your cardiac medications before your next appointment, please call your pharmacy*  Lab Work: Shawn Levering, NP recommends that you have lab work TODAY:  FLP CMP  Shawn Levering, NP recommends that you return for lab work in 2 weeks:  BMP  If you have labs (blood work) drawn today and your tests are completely normal, you will receive your results only by: MyChart Message (if you have MyChart) OR A paper copy in the mail If you have any lab test that is abnormal or we need to change your treatment, we will call you to review the results.  Testing/Procedures: NONE ordered at this time of appointment   Follow-Up: At [facility], you and your health needs are our priority.  As part of our continuing mission to provide you with exceptional heart care, we have created designated Provider Care Teams.  These Care Teams include your primary Cardiologist (physician) and Advanced Practice Providers (APPs -  Physician Assistants and Nurse Practitioners) who all work together to provide you with the care you need, when you need it.   Your next appointment:   8-10 week(s)  Provider:   Carlos Levering, NP        Other Instructions MONITOR blood pressure at home. Send reading via MyChart.

## 2022-09-28 ENCOUNTER — Other Ambulatory Visit: Payer: Self-pay | Admitting: Cardiovascular Disease

## 2022-09-28 DIAGNOSIS — E7800 Pure hypercholesterolemia, unspecified: Secondary | ICD-10-CM

## 2022-10-11 ENCOUNTER — Ambulatory Visit: Payer: BC Managed Care – PPO | Admitting: Adult Health

## 2022-10-11 ENCOUNTER — Other Ambulatory Visit: Payer: Self-pay

## 2022-10-11 DIAGNOSIS — Z79899 Other long term (current) drug therapy: Secondary | ICD-10-CM

## 2022-10-11 LAB — BASIC METABOLIC PANEL
BUN/Creatinine Ratio: 14 (ref 10–24)
BUN: 18 mg/dL (ref 8–27)
CO2: 23 mmol/L (ref 20–29)
Calcium: 9 mg/dL (ref 8.6–10.2)
Chloride: 106 mmol/L (ref 96–106)
Creatinine, Ser: 1.32 mg/dL — ABNORMAL HIGH (ref 0.76–1.27)
Glucose: 96 mg/dL (ref 70–99)
Potassium: 5 mmol/L (ref 3.5–5.2)
Sodium: 141 mmol/L (ref 134–144)
eGFR: 62 mL/min/{1.73_m2} (ref >59)

## 2022-10-18 DIAGNOSIS — I10 Essential (primary) hypertension: Secondary | ICD-10-CM

## 2022-10-18 MED ORDER — LOSARTAN POTASSIUM 50 MG PO TABS: 50.0000 mg | ORAL_TABLET | Freq: Every day | ORAL | 3 refills | Status: DC

## 2022-10-18 NOTE — Telephone Encounter (Signed)
BP still remains high. Please ask patient to increase Losartan to 50 mg daily.  Thank you! DW

## 2022-11-19 NOTE — Progress Notes (Unsigned)
   Cardiology Clinic Note   Date: 11/19/2022 ID: Shawn Kent, DOB Mar 02, 1961, MRN [id number]  Primary Cardiologist:  Nanetta Batty, MD  Patient Profile    Shawn Kent is a 62 y.o. adult who presents to the clinic today for ***    Past medical history significant for: Hypertension. Hyperlipidemia. CT cardiac scoring 12/25/2017: Coronary calcium score of 0. Lipid panel 09/27/2022: LDL 96, HDL 57, TG 92, total 170. Carotid artery disease. Carotid ultrasound 09/19/2017: 1 to 39% bilateral ICA.      History of Present Illness    Shawn Kent was first evaluated by Dr. Allyson Sabal on 09/09/2017 to establish care secondary to family history of heart disease and plaque in right carotid artery at the request of Jarold Motto, PA-C.  Carotid ultrasound showed 1-39% stenosis bilateral ICA.  Coronary calcium score in September 2019 was 0.   Patient was last seen in the office by me on 09/27/2022 with complaints of elevated BP 180-190/80-100.  He described dizzy spells while seated and with position changes x 5 days that resolved at the time of his visit.  Patient reported taking an increased amount of Advil through the month of April secondary to a flare of sciatic/back pain.  He has since decreased his use.  He reported dietary indiscretion with eating fast food but did not add salt to his food.  Patient was started on losartan 25 mg daily.  2-week BP log showed continued elevated BP and losartan was increased to 50 mg.  Today, patient ***  Hypertension: BP today *** Patient denies headaches, dizziness or vision changes. Continue continue losartan. Hyperlipidemia.  LDL June 2024 96 continue atorvastatin.   ROS: All other systems reviewed and are otherwise negative except as noted in History of Present Illness.  Studies Reviewed       ***  Risk Assessment/Calculations    {Does this patient have ATRIAL FIBRILLATION?:[phone]} No BP recorded.  {Refresh Note OR Click here to enter BP   :1}***        Physical Exam    VS:  There were no vitals taken for this visit. , BMI There is no height or weight on file to calculate BMI.  GEN: Well nourished, well developed, in no acute distress. Neck: No JVD or carotid bruits. Cardiac: *** RRR. No murmurs. No rubs or gallops.   Respiratory:  Respirations regular and unlabored. Clear to auscultation without rales, wheezing or rhonchi. GI: Soft, nontender, nondistended. Extremities: Radials/DP/PT 2+ and equal bilaterally. No clubbing or cyanosis. No edema ***  Skin: Warm and dry, no rash. Neuro: Strength intact.  Assessment & Plan   ***  Disposition: ***     {Are you ordering a CV Procedure (e.g. stress test, cath, DCCV, TEE, etc)?   Press F2        :[id number]}   Signed, Etta Grandchild. Chloris Marcoux, DNP, NP-C

## 2022-11-22 ENCOUNTER — Encounter: Payer: Self-pay | Admitting: Student

## 2022-11-22 ENCOUNTER — Ambulatory Visit: Payer: BC Managed Care – PPO | Attending: Student | Admitting: Student

## 2022-11-22 VITALS — BP 140/86 | HR 70 | Ht 73.0 in | Wt 225.8 lb

## 2022-11-22 DIAGNOSIS — E782 Mixed hyperlipidemia: Secondary | ICD-10-CM

## 2022-11-22 DIAGNOSIS — I10 Essential (primary) hypertension: Secondary | ICD-10-CM

## 2022-11-22 NOTE — Patient Instructions (Signed)
Medication Instructions:  No medication changes Check your blood pressure 2 hours after taking your medication. Please upload up log to mychart for review. *If you need a refill on your cardiac medications before your next appointment, please call your pharmacy*   Lab Work: No labs ordered If you have labs (blood work) drawn today and your tests are completely normal, you will receive your results only by: MyChart Message (if you have MyChart) OR A paper copy in the mail If you have any lab test that is abnormal or we need to change your treatment, we will call you to review the results.   Testing/Procedures: None ordered   Follow-Up: At [facility], you and your health needs are our priority.  As part of our continuing mission to provide you with exceptional heart care, we have created designated Provider Care Teams.  These Care Teams include your primary Cardiologist (physician) and Advanced Practice Providers (APPs -  Physician Assistants and Nurse Practitioners) who all work together to provide you with the care you need, when you need it.  We recommend signing up for the patient portal called "MyChart".  Sign up information is provided on this After Visit Summary.  MyChart is used to connect with patients for Virtual Visits (Telemedicine).  Patients are able to view lab/test results, encounter notes, upcoming appointments, etc.  Non-urgent messages can be sent to your provider as well.   To learn more about what you can do with MyChart, go to [email or web address].    Your next appointment:   6 month(s)  Provider:   Nanetta Batty, MD     Other Instructions A letter will be sent to your in the mail as a reminder to call the office for your follow up appointment.

## 2022-12-03 ENCOUNTER — Telehealth: Payer: Self-pay

## 2022-12-03 ENCOUNTER — Other Ambulatory Visit: Payer: Self-pay

## 2022-12-03 MED ORDER — AMLODIPINE BESYLATE 5 MG PO TABS: 5.0000 mg | ORAL_TABLET | Freq: Every day | ORAL | 2 refills | Status: DC

## 2022-12-03 NOTE — Telephone Encounter (Signed)
-----   Message from Carlos Levering sent at 12/03/2022  7:46 AM EDT ----- Duncan Dull,  Will you please send in Amlodipine 5 mg daily for this patient. He is aware of this addition.   Thank you!  DW

## 2023-05-03 DIAGNOSIS — E782 Mixed hyperlipidemia: Secondary | ICD-10-CM | POA: Diagnosis not present

## 2023-05-03 DIAGNOSIS — Z1211 Encounter for screening for malignant neoplasm of colon: Secondary | ICD-10-CM | POA: Diagnosis not present

## 2023-05-03 DIAGNOSIS — I10 Essential (primary) hypertension: Secondary | ICD-10-CM | POA: Diagnosis not present

## 2023-05-03 DIAGNOSIS — Z860101 Personal history of adenomatous and serrated colon polyps: Secondary | ICD-10-CM | POA: Diagnosis not present

## 2023-05-24 DIAGNOSIS — G43711 Chronic migraine without aura, intractable, with status migrainosus: Secondary | ICD-10-CM | POA: Diagnosis not present

## 2023-06-08 ENCOUNTER — Encounter: Payer: Self-pay | Admitting: Physician Assistant

## 2023-06-08 DIAGNOSIS — Z1211 Encounter for screening for malignant neoplasm of colon: Secondary | ICD-10-CM | POA: Diagnosis not present

## 2023-06-08 DIAGNOSIS — K635 Polyp of colon: Secondary | ICD-10-CM | POA: Diagnosis not present

## 2023-06-08 DIAGNOSIS — Z860100 Personal history of colon polyps, unspecified: Secondary | ICD-10-CM | POA: Diagnosis not present

## 2023-06-08 DIAGNOSIS — D120 Benign neoplasm of cecum: Secondary | ICD-10-CM | POA: Diagnosis not present

## 2023-06-08 DIAGNOSIS — Z860101 Personal history of adenomatous and serrated colon polyps: Secondary | ICD-10-CM | POA: Diagnosis not present

## 2023-06-08 DIAGNOSIS — K648 Other hemorrhoids: Secondary | ICD-10-CM | POA: Diagnosis not present

## 2023-06-08 LAB — HM COLONOSCOPY

## 2023-09-01 ENCOUNTER — Other Ambulatory Visit: Payer: Self-pay | Admitting: Cardiovascular Disease

## 2023-09-01 ENCOUNTER — Other Ambulatory Visit: Payer: Self-pay | Admitting: Student

## 2023-09-01 DIAGNOSIS — I10 Essential (primary) hypertension: Secondary | ICD-10-CM

## 2023-09-01 DIAGNOSIS — E7800 Pure hypercholesterolemia, unspecified: Secondary | ICD-10-CM

## 2023-10-11 ENCOUNTER — Ambulatory Visit: Admitting: Cardiovascular Disease

## 2023-10-18 ENCOUNTER — Encounter: Payer: Self-pay | Admitting: Cardiovascular Disease

## 2023-10-18 ENCOUNTER — Ambulatory Visit: Attending: Cardiology | Admitting: Cardiovascular Disease

## 2023-10-18 VITALS — BP 140/80 | HR 53 | Ht 73.0 in | Wt 223.0 lb

## 2023-10-18 DIAGNOSIS — I10 Essential (primary) hypertension: Secondary | ICD-10-CM | POA: Diagnosis not present

## 2023-10-18 DIAGNOSIS — I779 Disorder of arteries and arterioles, unspecified: Secondary | ICD-10-CM

## 2023-10-18 DIAGNOSIS — E782 Mixed hyperlipidemia: Secondary | ICD-10-CM | POA: Diagnosis not present

## 2023-10-18 LAB — LIPID PANEL

## 2023-10-18 NOTE — Progress Notes (Signed)
 10/18/2023 Donnice Large   1960/07/26  [id number]  Primary Physician Pcp, No Primary Cardiologist: Dorn JINNY Lesches MD [facility] [place], [place]  HPI:  Shawn Kent is a 63 y.o.  mildly overweight married Caucasian male father of 2 children who I last saw i last saw f in the office 04/24/2021.  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  He is initially from [place]  and lived in [place]  as well.  He has 2 masters degrees.  He is referred by Lucie Buttner, PA for cardiovascular evaluation because of family history of heart disease plaque in his right carotid artery by duplex ultrasound.  His risk factors include family history with a father who had his first myocardial infarction in his 71s.  He does have mild hyperlipidemia with an LDL of 119 and mild hypertension as well by readings in doctor's offices recently.  He is never had a heart attack or stroke.  He denies chest pain or shortness of breath.  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 He had carotid Dopplers performed in our office 09/19/2017 which were in entirely normal.   Since I saw him in the office 2 years ago he continues to do well.  He no longer is working out like he was in the past because his gym closed.  He did have a coronary calcium  score of 0 performed 12/25/2017.  He denies chest pain or shortness of breath.   Current Meds  Medication Sig   amLODipine  (NORVASC ) 5 MG tablet Take 1 tablet (5 mg total) by mouth daily.   ibuprofen (ADVIL) 100 MG/5ML suspension Take 200 mg by mouth as needed.   losartan  (COZAAR ) 50 MG tablet TAKE 1 TABLET BY MOUTH EVERY DAY   rosuvastatin  (CRESTOR ) 5 MG  tablet TAKE 1 TABLET BY MOUTH EVERY DAY     No Known Allergies  Social History   Socioeconomic History   Marital status: Married    Spouse name: Not on file   Number of children: Not on file   Years of education: Not on file   Highest education level: Not on file  Occupational History   Not on file  Tobacco Use   Smoking status: Never   Smokeless tobacco: Never  Vaping Use   Vaping status: Never Used  Substance and Sexual Activity   Alcohol use: Yes    Alcohol/week: 10.0 standard drinks of alcohol    Types: 10 Cans of beer per week   Drug use: Never   Sexual activity: Yes  Other Topics Concern   Not on file  Social History Narrative   Works for Engineer, agricultural    Two children -- 8 and 42 y/o   Married   Social Drivers of Corporate investment banker Strain: Not on Ship broker Insecurity: Not on file  Transportation Needs: Not on file  Physical Activity: Not on file  Stress: Not on file  Social Connections: Not on file  Intimate Partner Violence: Not on file     Review of Systems: General: negative for chills, fever, night sweats or weight changes.  Cardiovascular: negative for chest pain, dyspnea on exertion, edema,  orthopnea, palpitations, paroxysmal nocturnal dyspnea or shortness of breath Dermatological: negative for rash Respiratory: negative for cough or wheezing Urologic: negative for hematuria Abdominal: negative for nausea, vomiting, diarrhea, bright red blood per rectum, melena, or hematemesis Neurologic: negative for visual changes, syncope, or dizziness All other systems reviewed and are otherwise negative except as noted above.    Blood pressure (!) 140/80, pulse (!) 53, height 6' 1 (1.854 m), weight 223 lb (101.2 kg), SpO2 98%.  General appearance: alert and no distress Neck: no adenopathy, no carotid bruit, no JVD, supple, symmetrical, trachea midline, and thyroid  not enlarged, symmetric, no tenderness/mass/nodules Lungs: clear to auscultation  bilaterally Heart: regular rate and rhythm, S1, S2 normal, no murmur, click, rub or gallop Extremities: extremities normal, atraumatic, no cyanosis or edema Pulses: 2+ and symmetric Skin: Skin color, texture, turgor normal. No rashes or lesions Neurologic: Grossly normal  EKG EKG Interpretation Date/Time:  Tuesday October 18 2023 10:38:14 EDT Ventricular Rate:  53 PR Interval:  204 QRS Duration:  82 QT Interval:  432 QTC Calculation: 405 R Axis:   -13  Text Interpretation: Sinus bradycardia No previous ECGs available Confirmed by Court Carrier [phone]) on 10/18/2023 10:45:46 AM    ASSESSMENT AND PLAN:   Hyperlipidemia History of hyperlipidemia on low-dose statin therapy with lipid profile performed 09/27/2022 revealing a total cholesterol 170, LDL of 96 and HDL 57, acceptable for primary prevention given a coronary calcium  score of 0.  We will check a lipid liver profile today.  Essential hypertension History of essential hypertension with blood pressure measured today at 140/80.  He is on amlodipine  and losartan .     Carrier DOROTHA Court MD FACP,FACC,FAHA, [facility] 10/18/2023 10:58 AM

## 2023-10-18 NOTE — Assessment & Plan Note (Signed)
 History of hyperlipidemia on low-dose statin therapy with lipid profile performed 09/27/2022 revealing a total cholesterol 170, LDL of 96 and HDL 57, acceptable for primary prevention given a coronary calcium  score of 0.  We will check a lipid liver profile today.

## 2023-10-18 NOTE — Patient Instructions (Signed)
 Medication Instructions:  Your physician recommends that you continue on your current medications as directed. Please refer to the Current Medication list given to you today.  *If you need a refill on your cardiac medications before your next appointment, please call your pharmacy*  Lab Work: Your physician recommends that you have labs drawn today: Lipid/liver panel  If you have labs (blood work) drawn today and your tests are completely normal, you will receive your results only by: MyChart Message (if you have MyChart) OR A paper copy in the mail If you have any lab test that is abnormal or we need to change your treatment, we will call you to review the results.   Follow-Up: At [facility], you and your health needs are our priority.  As part of our continuing mission to provide you with exceptional heart care, our providers are all part of one team.  This team includes your primary Cardiologist (physician) and Advanced Practice Providers or APPs (Physician Assistants and Nurse Practitioners) who all work together to provide you with the care you need, when you need it.  Your next appointment:   12 month(s)  Provider:   Lauro Portal, MD    We recommend signing up for the patient portal called "MyChart".  Sign up information is provided on this After Visit Summary.  MyChart is used to connect with patients for Virtual Visits (Telemedicine).  Patients are able to view lab/test results, encounter notes, upcoming appointments, etc.  Non-urgent messages can be sent to your provider as well.   To learn more about what you can do with MyChart, go to [email or web address].

## 2023-10-18 NOTE — Assessment & Plan Note (Signed)
 History of essential hypertension with blood pressure measured today at 140/80.  He is on amlodipine  and losartan .

## 2023-10-19 ENCOUNTER — Ambulatory Visit: Payer: Self-pay | Admitting: Cardiovascular Disease

## 2023-10-19 LAB — HEPATIC FUNCTION PANEL
ALT: 27 IU/L (ref 0–44)
AST: 25 IU/L (ref 0–40)
Albumin: 4.5 g/dL (ref 3.9–4.9)
Alkaline Phosphatase: 74 IU/L (ref 44–121)
Bilirubin Total: 0.4 mg/dL (ref 0.0–1.2)
Bilirubin, Direct: 0.16 mg/dL (ref 0.00–0.40)
Total Protein: 6.7 g/dL (ref 6.0–8.5)

## 2023-10-19 LAB — LIPID PANEL
Cholesterol, Total: 152 mg/dL (ref 100–199)
HDL: 56 mg/dL (ref >39)
LDL CALC COMMENT:: 2.7 ratio (ref 0.0–5.0)
LDL Chol Calc (NIH): 85 mg/dL (ref 0–99)
Triglycerides: 55 mg/dL (ref 0–149)
VLDL Cholesterol Cal: 11 mg/dL (ref 5–40)

## 2023-12-02 ENCOUNTER — Other Ambulatory Visit: Payer: Self-pay | Admitting: Cardiovascular Disease

## 2023-12-02 DIAGNOSIS — E7800 Pure hypercholesterolemia, unspecified: Secondary | ICD-10-CM

## 2024-01-24 DIAGNOSIS — Z1331 Encounter for screening for depression: Secondary | ICD-10-CM | POA: Diagnosis not present

## 2024-01-24 DIAGNOSIS — E785 Hyperlipidemia, unspecified: Secondary | ICD-10-CM | POA: Diagnosis not present

## 2024-01-24 DIAGNOSIS — I10 Essential (primary) hypertension: Secondary | ICD-10-CM | POA: Diagnosis not present

## 2024-01-24 DIAGNOSIS — Z125 Encounter for screening for malignant neoplasm of prostate: Secondary | ICD-10-CM | POA: Diagnosis not present

## 2024-01-24 DIAGNOSIS — Z0000 Encounter for general adult medical examination without abnormal findings: Secondary | ICD-10-CM | POA: Diagnosis not present

## 2024-01-26 DIAGNOSIS — Z789 Other specified health status: Secondary | ICD-10-CM | POA: Diagnosis not present

## 2024-01-26 DIAGNOSIS — L2989 Other pruritus: Secondary | ICD-10-CM | POA: Diagnosis not present

## 2024-01-26 DIAGNOSIS — L281 Prurigo nodularis: Secondary | ICD-10-CM | POA: Diagnosis not present

## 2024-01-26 DIAGNOSIS — L814 Other melanin hyperpigmentation: Secondary | ICD-10-CM | POA: Diagnosis not present

## 2024-01-26 DIAGNOSIS — D1801 Hemangioma of skin and subcutaneous tissue: Secondary | ICD-10-CM | POA: Diagnosis not present

## 2024-01-26 DIAGNOSIS — L538 Other specified erythematous conditions: Secondary | ICD-10-CM | POA: Diagnosis not present

## 2024-01-26 DIAGNOSIS — L821 Other seborrheic keratosis: Secondary | ICD-10-CM | POA: Diagnosis not present

## 2024-02-29 ENCOUNTER — Other Ambulatory Visit: Payer: Self-pay | Admitting: Student
